# Patient Record
Sex: Female | Born: 1955 | Race: White | Hispanic: No | Marital: Married | Smoking: Never smoker
Health system: Southern US, Community
[De-identification: ages and names within clinical notes are randomized; demographics above are authoritative.]

## PROBLEM LIST (undated history)

## (undated) DIAGNOSIS — E669 Obesity, unspecified: Secondary | ICD-10-CM

## (undated) DIAGNOSIS — E785 Hyperlipidemia, unspecified: Secondary | ICD-10-CM

## (undated) DIAGNOSIS — E039 Hypothyroidism, unspecified: Secondary | ICD-10-CM

## (undated) DIAGNOSIS — M8580 Other specified disorders of bone density and structure, unspecified site: Secondary | ICD-10-CM

## (undated) DIAGNOSIS — D649 Anemia, unspecified: Secondary | ICD-10-CM

## (undated) DIAGNOSIS — M1990 Unspecified osteoarthritis, unspecified site: Secondary | ICD-10-CM

## (undated) DIAGNOSIS — I10 Essential (primary) hypertension: Secondary | ICD-10-CM

## (undated) DIAGNOSIS — J45909 Unspecified asthma, uncomplicated: Secondary | ICD-10-CM

## (undated) HISTORY — PX: THYROID LOBECTOMY: SHX420

## (undated) HISTORY — PX: FUNCTIONAL ENDOSCOPIC SINUS SURGERY: SUR616

---

## 2005-07-11 ENCOUNTER — Ambulatory Visit: Payer: Self-pay

## 2006-07-30 ENCOUNTER — Ambulatory Visit: Payer: Self-pay

## 2006-12-17 ENCOUNTER — Ambulatory Visit: Payer: Self-pay | Admitting: Specialist

## 2007-09-03 ENCOUNTER — Ambulatory Visit (HOSPITAL_COMMUNITY): Admission: RE | Admit: 2007-09-03 | Discharge: 2007-09-03 | Payer: Self-pay | Admitting: Orthopedic Surgery

## 2007-09-08 ENCOUNTER — Ambulatory Visit: Payer: Self-pay

## 2007-10-18 ENCOUNTER — Ambulatory Visit: Payer: Self-pay | Admitting: Gastroenterology

## 2008-08-30 ENCOUNTER — Encounter: Payer: Self-pay | Admitting: Specialist

## 2008-09-11 ENCOUNTER — Encounter: Payer: Self-pay | Admitting: Specialist

## 2008-09-12 ENCOUNTER — Ambulatory Visit: Payer: Self-pay

## 2009-09-13 ENCOUNTER — Ambulatory Visit: Payer: Self-pay

## 2009-09-26 ENCOUNTER — Ambulatory Visit: Payer: Self-pay

## 2010-09-17 ENCOUNTER — Ambulatory Visit: Payer: Self-pay

## 2010-09-19 ENCOUNTER — Ambulatory Visit: Payer: Self-pay

## 2010-10-13 HISTORY — PX: BREAST BIOPSY: SHX20

## 2010-10-14 ENCOUNTER — Ambulatory Visit: Payer: Self-pay | Admitting: Surgery

## 2010-10-15 LAB — PATHOLOGY REPORT

## 2011-04-21 ENCOUNTER — Ambulatory Visit: Payer: Self-pay | Admitting: Surgery

## 2011-12-03 ENCOUNTER — Ambulatory Visit: Payer: Self-pay

## 2012-12-08 ENCOUNTER — Ambulatory Visit: Payer: Self-pay

## 2013-01-25 ENCOUNTER — Ambulatory Visit: Payer: Self-pay | Admitting: Family Medicine

## 2013-02-21 ENCOUNTER — Ambulatory Visit: Payer: Self-pay | Admitting: Internal Medicine

## 2013-09-22 DIAGNOSIS — M8580 Other specified disorders of bone density and structure, unspecified site: Secondary | ICD-10-CM | POA: Insufficient documentation

## 2013-09-22 DIAGNOSIS — E785 Hyperlipidemia, unspecified: Secondary | ICD-10-CM | POA: Insufficient documentation

## 2013-12-19 ENCOUNTER — Ambulatory Visit: Payer: Self-pay

## 2014-11-28 ENCOUNTER — Other Ambulatory Visit: Payer: Self-pay | Admitting: Obstetrics and Gynecology

## 2014-11-28 DIAGNOSIS — Z1231 Encounter for screening mammogram for malignant neoplasm of breast: Secondary | ICD-10-CM

## 2014-12-20 ENCOUNTER — Other Ambulatory Visit: Payer: Self-pay

## 2014-12-20 MED ORDER — MONTELUKAST SODIUM 10 MG PO TABS: 10.0000 mg | ORAL_TABLET | Freq: Every day | ORAL | Status: DC

## 2014-12-21 ENCOUNTER — Ambulatory Visit
Admission: RE | Admit: 2014-12-21 | Discharge: 2014-12-21 | Disposition: A | Discharge status: Home / Self Care | Payer: BLUE CROSS/BLUE SHIELD | Source: Ambulatory Visit | Attending: Obstetrics and Gynecology | Admitting: Obstetrics and Gynecology

## 2014-12-21 ENCOUNTER — Other Ambulatory Visit: Payer: Self-pay | Admitting: Family Medicine

## 2014-12-21 DIAGNOSIS — Z1231 Encounter for screening mammogram for malignant neoplasm of breast: Secondary | ICD-10-CM

## 2014-12-21 MED ORDER — MONTELUKAST SODIUM 10 MG PO TABS: 10.0000 mg | ORAL_TABLET | Freq: Every day | ORAL | Status: DC

## 2014-12-23 ENCOUNTER — Other Ambulatory Visit: Payer: Self-pay | Admitting: Family Medicine

## 2015-02-20 DIAGNOSIS — M25559 Pain in unspecified hip: Secondary | ICD-10-CM | POA: Insufficient documentation

## 2015-04-18 ENCOUNTER — Ambulatory Visit (INDEPENDENT_AMBULATORY_CARE_PROVIDER_SITE_OTHER): Payer: BLUE CROSS/BLUE SHIELD | Admitting: *Deleted

## 2015-04-18 VITALS — Temp 98.5°F | Resp 16 | Ht 65.7 in | Wt 218.0 lb

## 2015-04-18 DIAGNOSIS — Z23 Encounter for immunization: Secondary | ICD-10-CM

## 2015-06-18 ENCOUNTER — Ambulatory Visit: Payer: BLUE CROSS/BLUE SHIELD | Attending: Nurse Practitioner | Admitting: Physical Therapy

## 2015-06-18 DIAGNOSIS — R29898 Other symptoms and signs involving the musculoskeletal system: Secondary | ICD-10-CM

## 2015-06-18 DIAGNOSIS — M25552 Pain in left hip: Secondary | ICD-10-CM | POA: Diagnosis present

## 2015-06-18 DIAGNOSIS — M25551 Pain in right hip: Secondary | ICD-10-CM | POA: Insufficient documentation

## 2015-06-18 DIAGNOSIS — M6289 Other specified disorders of muscle: Secondary | ICD-10-CM | POA: Diagnosis present

## 2015-06-18 NOTE — Therapy (Signed)
[place] [facility] [address], [place], [address] Phone: [phone]   Fax:  [phone]  Physical Therapy Evaluation  Patient Details  Name: IYANUOLUWA ROIGER MRN: [id number] Date of Birth: Jun 15, 1956 Referring Provider: Sabra Heck  Encounter Date: 06/18/2015      PT End of Session - 06/18/15 1723    Visit Number 1   Number of Visits 13   Date for PT Re-Evaluation 07/30/15   PT Start Time 1630   PT Stop Time 1710   PT Time Calculation (min) 40 min   Activity Tolerance Patient tolerated treatment well;Patient limited by pain   Behavior During Therapy [facility] for tasks assessed/performed      No past medical history on file.  Past Surgical History  Procedure Laterality Date  . Breast biopsy Right 10/2010    negative stereotactic    There were no vitals filed for this visit.  Visit Diagnosis:  Bilateral hip pain - Plan: PT plan of care cert/re-cert  Weakness of both hips - Plan: PT plan of care cert/re-cert      Subjective Assessment - 06/18/15 1713    Subjective Patient reports she has ad bilateral hip pain for the last year. She is unable to walk like she used to and cannot exercise anymore. She has had 3 cortisone shots in her hips that did not help at all.    Limitations Walking;House hold activities   How long can you sit comfortably? 1 hr   How long can you stand comfortably? < 1hr   How long can you walk comfortably? <85min   Diagnostic tests xrays- showed no arthritis   Patient Stated Goals to be able to walk and exercise like she used to. To decrease pain.    Currently in Pain? Yes   Pain Score 6    Pain Location Hip   Pain Orientation Right;Left;Lateral;Proximal   Pain Descriptors / Indicators Aching   Pain Type Chronic pain   Pain Onset More than a month ago   Pain Frequency Intermittent   Aggravating Factors  walking, standing   Pain Relieving Factors sitting   Effect of Pain on Daily  Activities unable to walk long distances or exercise   Multiple Pain Sites No            [facility] PT Assessment - 06/18/15 0001    Assessment   Medical Diagnosis bilateral hip bursitis   Referring Provider Sabra Heck   Onset Date/Surgical Date 04/17/14   Prior Therapy No   Precautions   Precautions None   Restrictions   Weight Bearing Restrictions No   Balance Screen   Has the patient fallen in the past 6 months No   Has the patient had a decrease in activity level because of a fear of falling?  Yes   Is the patient reluctant to leave their home because of a fear of falling?  No   Home Social worker Private residence   Living Arrangements Spouse/significant other   Type of [place] to enter   Entrance Stairs-Number of Steps [place] - 2 wheels;Cane - single point;Grab bars - toilet;Tub bench;Bedside commode   Prior Function   Level of Independence Independent   Vocation Full time employment   Leisure Golf, exercise, walking   Cognition   Overall Cognitive Status Within Functional Limits for tasks assessed   Observation/Other Assessments  Lower Extremity Functional Scale  24/80   ROM / Strength   AROM / PROM / Strength PROM;Strength   PROM   Overall PROM  Within functional limits for tasks performed   Strength   Overall Strength Deficits   Overall Strength Comments Patient demonstrates weakness in bilateral hip flexors, 3+/5   Ambulation/Gait   Gait velocity 10 meters in <8 sec         Treatment this session to include: Rolling of left hip and manual therapy STM to bilateral hips for pain reduction. Exercises to include: Sidelying clam shells, prone hip extension, quadruped hip extension and abduction x 10 reps each bilaterally.   Cues and demonstration provided to patient.        PT Education - 06/18/15 1723    Education provided Yes   Education Details POC, HEP    Person(s) Educated Patient   Methods Explanation;Demonstration   Comprehension Verbalized understanding;Returned demonstration;Verbal cues required             PT Long Term Goals - 06/18/15 1728    PT LONG TERM GOAL #1   Title Patient will report decreased pain with aggrivating activities to < 3/10   Baseline 6/10   Time 6   Period Weeks   Status New   PT LONG TERM GOAL #2   Title Patient will be able to walk for > 6 min without pain reported for >1200'   Baseline Unable to try   Time 6   Period Weeks   Status New   PT LONG TERM GOAL #3   Title Patient will have improved functional independence with LEFS score of > 35/80   Baseline 24/80   Time 6   Period Weeks   Status New               Plan - 06/18/15 1724    Clinical Impression Statement Patient is a 59 year old female who reports bilateral hip pain with walking. Patient reports she had negative xrays and has received steroid injections which did not help. Pt has been dealing with this pain for the last year. She is unable to walk like she used to for exercise.    Pt will benefit from skilled therapeutic intervention in order to improve on the following deficits Decreased strength;Pain;Difficulty walking;Decreased activity tolerance   Rehab Potential Fair   PT Frequency 2x / week   PT Duration 6 weeks   PT Treatment/Interventions Aquatic Therapy;Moist Heat;Therapeutic exercise;Therapeutic activities;Dry needling;Patient/family education;Manual techniques   PT Next Visit Plan manual therapy for pain, muscle irritation, exercises to improve hip and glute strength.    PT Home Exercise Plan clam shells, hip extension in prone.    Consulted and Agree with Plan of Care Patient         Problem List There are no active problems to display for this patient.   Venia Riveron, MPT, GCS 06/18/2015, 5:32 PM  [place] [facility] [address]  [place], [address] Phone: [phone]   Fax:  [phone]  Name: SUEANN JOSSELYN MRN: [id number] Date of Birth: 1956/02/25

## 2015-06-21 ENCOUNTER — Ambulatory Visit: Payer: BLUE CROSS/BLUE SHIELD | Admitting: Physical Therapy

## 2015-06-21 DIAGNOSIS — M25552 Pain in left hip: Principal | ICD-10-CM

## 2015-06-21 DIAGNOSIS — M25551 Pain in right hip: Secondary | ICD-10-CM | POA: Diagnosis not present

## 2015-06-21 DIAGNOSIS — R29898 Other symptoms and signs involving the musculoskeletal system: Secondary | ICD-10-CM

## 2015-06-21 NOTE — Therapy (Signed)
[place] [facility] [address] [place], [address] Phone: [phone]   Fax:  [phone]  Physical Therapy Treatment  Patient Details  Name: Catherine Mcclain MRN: [id number] Date of Birth: April 14, 1956 Referring Provider: Dayton Martes  Encounter Date: 06/21/2015      PT End of Session - 06/21/15 1705    Visit Number 2   Number of Visits 13   Date for PT Re-Evaluation 07/30/15   PT Start Time 1630   PT Stop Time 1700   PT Time Calculation (min) 30 min   Activity Tolerance Patient tolerated treatment well;Patient limited by pain   Behavior During Therapy [facility] for tasks assessed/performed      No past medical history on file.  Past Surgical History  Procedure Laterality Date  . Breast biopsy Right 10/2010    negative stereotactic    There were no vitals filed for this visit.  Visit Diagnosis:  Bilateral hip pain  Weakness of both hips      Subjective Assessment - 06/21/15 1659    Subjective Patient reports she was very sore after first session. Feeling about the same so far.    Limitations Walking;House hold activities   How long can you sit comfortably? 1 hr   How long can you stand comfortably? < 1hr   How long can you walk comfortably? <77min   Diagnostic tests xrays- showed no arthritis   Patient Stated Goals to be able to walk and exercise like she used to. To decrease pain.    Currently in Pain? Yes   Pain Location Hip   Pain Orientation Right;Left   Pain Descriptors / Indicators Aching;Sore   Pain Type Chronic pain   Pain Onset More than a month ago   Pain Frequency Intermittent   Multiple Pain Sites No          Treatment this session to include: manual therapy,  STM to bilateral hips for pain reduction. PA joint mobs to lower back grade I x 3 bouts. Exercises to include: prone hip extension, quadruped hip abduction and hip machine:abduction 40# x 10 reps, hip extension 55# x 10 reps each  bilaterally. Single leg kneeling x 1 min on each side for stability.Cga for safety.   Cues and demonstration provided to patient.           PT Education - 06/21/15 1704    Education provided Yes   Education Details HEp and proper form, rational.   Person(s) Educated Patient   Methods Explanation;Demonstration   Comprehension Verbalized understanding;Returned demonstration             PT Long Term Goals - 06/18/15 1728    PT LONG TERM GOAL #1   Title Patient will report decreased pain with aggrivating activities to < 3/10   Baseline 6/10   Time 6   Period Weeks   Status New   PT LONG TERM GOAL #2   Title Patient will be able to walk for > 6 min without pain reported for >1200'   Baseline Unable to try   Time 6   Period Weeks   Status New   PT LONG TERM GOAL #3   Title Patient will have improved functional independence with LEFS score of > 35/80   Baseline 24/80   Time 6   Period Weeks   Status New               Plan - 06/21/15 1706  Clinical Impression Statement Patient reports significant soreness after first treatment, she tolerated activities well with session. We kept session on the shorter side to progress slowly.    Pt will benefit from skilled therapeutic intervention in order to improve on the following deficits Decreased strength;Pain;Difficulty walking;Decreased activity tolerance   Rehab Potential Fair   PT Frequency 2x / week   PT Duration 6 weeks   PT Treatment/Interventions Aquatic Therapy;Moist Heat;Therapeutic exercise;Therapeutic activities;Dry needling;Patient/family education;Manual techniques   PT Next Visit Plan manual therapy for pain, muscle irritation, exercises to improve hip and glute strength.    PT Home Exercise Plan clam shells, hip extension in prone.    Consulted and Agree with Plan of Care Patient        Problem List There are no active problems to display for this patient.   Jani Moronta, PT, MPT,  GCS 06/21/2015, 5:08 PM  [place] [facility] [address], [place], [address] Phone: [phone]   Fax:  [phone]  Name: TRUDEE ORTEGA MRN: [id number] Date of Birth: [birth year]

## 2015-06-25 ENCOUNTER — Ambulatory Visit: Payer: BLUE CROSS/BLUE SHIELD | Admitting: Physical Therapy

## 2015-06-25 DIAGNOSIS — M25551 Pain in right hip: Secondary | ICD-10-CM | POA: Diagnosis not present

## 2015-06-25 DIAGNOSIS — R29898 Other symptoms and signs involving the musculoskeletal system: Secondary | ICD-10-CM

## 2015-06-25 DIAGNOSIS — M25552 Pain in left hip: Principal | ICD-10-CM

## 2015-06-25 NOTE — Therapy (Signed)
[place] [facility] [address] [place], [address] Phone: [phone]   Fax:  [phone]  Physical Therapy Treatment  Patient Details  Name: Catherine Mcclain MRN: [id number] Date of Birth: Apr 06, 1956 Referring Provider: Dayton Martes  Encounter Date: 06/25/2015      PT End of Session - 06/25/15 1711    Visit Number 3   Number of Visits 13   Date for PT Re-Evaluation 07/30/15   PT Start Time 1630   PT Stop Time 1710   PT Time Calculation (min) 40 min   Activity Tolerance Patient tolerated treatment well;No increased pain   Behavior During Therapy [facility] for tasks assessed/performed      No past medical history on file.  Past Surgical History  Procedure Laterality Date  . Breast biopsy Right 10/2010    negative stereotactic    There were no vitals filed for this visit.  Visit Diagnosis:  Bilateral hip pain  Weakness of both hips      Subjective Assessment - 06/25/15 1707    Subjective Patient reports she was blowing leaves yesterday and her back is a little sore. Hips are feeling less tender.   Limitations Walking;House hold activities   Patient Stated Goals to be able to walk and exercise like she used to. To decrease pain.    Currently in Pain? Yes   Pain Location Hip   Pain Orientation Right;Left   Pain Descriptors / Indicators Aching;Sore   Pain Type Chronic pain   Pain Onset More than a month ago   Pain Frequency Intermittent   Multiple Pain Sites No          Treatment this session to include: manual therapy, STM to bilateral hips for pain reduction.  Exercises to include: prone hip extension 2 x10,  hip machine:abduction 40# x 10 reps, 25# x 10 reps, hip extension 55# 3 x 10 reps each bilaterally.  Cues and demonstration provided to patient.            PT Education - 06/25/15 1710    Education provided Yes   Education Details Proper form with exercises.    Person(s) Educated  Patient   Methods Explanation;Demonstration   Comprehension Verbalized understanding;Returned demonstration             PT Long Term Goals - 06/18/15 1728    PT LONG TERM GOAL #1   Title Patient will report decreased pain with aggrivating activities to < 3/10   Baseline 6/10   Time 6   Period Weeks   Status New   PT LONG TERM GOAL #2   Title Patient will be able to walk for > 6 min without pain reported for >1200'   Baseline Unable to try   Time 6   Period Weeks   Status New   PT LONG TERM GOAL #3   Title Patient will have improved functional independence with LEFS score of > 35/80   Baseline 24/80   Time 6   Period Weeks   Status New               Plan - 06/25/15 1712    Clinical Impression Statement Patient reports she is feeling some decreased soreness.    Pt will benefit from skilled therapeutic intervention in order to improve on the following deficits Decreased strength;Pain;Difficulty walking;Decreased activity tolerance   Rehab Potential Fair   PT Frequency 2x / week   PT Duration 6 weeks   PT  Treatment/Interventions Aquatic Therapy;Moist Heat;Therapeutic exercise;Therapeutic activities;Dry needling;Patient/family education;Manual techniques   PT Next Visit Plan manual therapy for pain, muscle irritation, exercises to improve hip and glute strength.    PT Home Exercise Plan clam shells, hip extension in prone.    Consulted and Agree with Plan of Care Patient        Problem List There are no active problems to display for this patient.   Dilyn Smiles, PT, MPT, GCS 06/25/2015, 5:15 PM  [place] [facility] [address], [place], [address] Phone: [phone]   Fax:  [phone]  Name: Catherine Mcclain MRN: [id number] Date of Birth: 1955/10/23

## 2015-06-28 ENCOUNTER — Ambulatory Visit: Payer: BLUE CROSS/BLUE SHIELD | Admitting: Physical Therapy

## 2015-06-28 DIAGNOSIS — M25552 Pain in left hip: Principal | ICD-10-CM

## 2015-06-28 DIAGNOSIS — R29898 Other symptoms and signs involving the musculoskeletal system: Secondary | ICD-10-CM

## 2015-06-28 DIAGNOSIS — M25551 Pain in right hip: Secondary | ICD-10-CM

## 2015-06-28 NOTE — Therapy (Signed)
[place] [facility] [address] [place], [address] Phone: [phone]   Fax:  [phone]  Physical Therapy Treatment  Patient Details  Name: Catherine Mcclain MRN: [id number] Date of Birth: 03-08-1956 Referring Provider: Dayton Martes  Encounter Date: 06/28/2015      PT End of Session - 06/28/15 1709    Visit Number 4   Number of Visits 13   Date for PT Re-Evaluation 07/30/15   PT Start Time 1630   PT Stop Time 1708   PT Time Calculation (min) 38 min   Activity Tolerance Patient tolerated treatment well;No increased pain   Behavior During Therapy [facility] for tasks assessed/performed      No past medical history on file.  Past Surgical History  Procedure Laterality Date  . Breast biopsy Right 10/2010    negative stereotactic    There were no vitals filed for this visit.  Visit Diagnosis:  Bilateral hip pain  Weakness of both hips      Subjective Assessment - 06/28/15 1707    Subjective Patient reports she feels some improvement.    Limitations Walking;House hold activities   How long can you sit comfortably? 1 hr   How long can you stand comfortably? < 1hr   How long can you walk comfortably? <66min   Diagnostic tests xrays- showed no arthritis   Patient Stated Goals to be able to walk and exercise like she used to. To decrease pain.    Currently in Pain? No/denies   Pain Location Hip   Pain Orientation Right;Left   Pain Descriptors / Indicators Aching;Sore   Pain Type Chronic pain   Pain Onset More than a month ago   Pain Frequency Intermittent   Multiple Pain Sites No           Treatment this session to include: manual therapy,rolling of B hips with "the stick" STM to bilateral hips for pain reduction.  Exercises to include: prone hip extension 2 x10, hip machine:abduction 40# x 10 reps, 25# x 10 reps, hip extension 55# 3 x 10 reps each bilaterally. Leg extension machine  2x10 reps 25#,  wedding march gait x 30'x 3 reps with 5# in each hand.   Cues and demonstration provided to patient          PT Education - 06/28/15 1709    Education provided Yes   Education Details new exercises and rationale for exercises/modalities.    Person(s) Educated Patient   Methods Explanation;Demonstration;Verbal cues   Comprehension Verbalized understanding;Returned demonstration             PT Long Term Goals - 06/18/15 1728    PT LONG TERM GOAL #1   Title Patient will report decreased pain with aggrivating activities to < 3/10   Baseline 6/10   Time 6   Period Weeks   Status New   PT LONG TERM GOAL #2   Title Patient will be able to walk for > 6 min without pain reported for >1200'   Baseline Unable to try   Time 6   Period Weeks   Status New   PT LONG TERM GOAL #3   Title Patient will have improved functional independence with LEFS score of > 35/80   Baseline 24/80   Time 6   Period Weeks   Status New               Plan - 06/28/15 1713    Clinical Impression Statement  Patient is progressing well. She is able to tolerate increased activity without as much pain.    Pt will benefit from skilled therapeutic intervention in order to improve on the following deficits Decreased strength;Pain;Difficulty walking;Decreased activity tolerance   Rehab Potential Fair   PT Frequency 2x / week   PT Duration 6 weeks   PT Treatment/Interventions Aquatic Therapy;Moist Heat;Therapeutic exercise;Therapeutic activities;Dry needling;Patient/family education;Manual techniques   PT Next Visit Plan manual therapy for pain, muscle irritation, exercises to improve hip and glute strength.    PT Home Exercise Plan clam shells, hip extension in prone.    Consulted and Agree with Plan of Care Patient        Problem List There are no active problems to display for this patient.   Cherl Gorney, PT, MPT, GCS 06/28/2015, 5:15 PM  [place] [facility] [address], [place], [address] Phone: [phone]   Fax:  [phone]  Name: Catherine Mcclain MRN: [id number] Date of Birth: 01-15-1956

## 2015-07-02 ENCOUNTER — Ambulatory Visit: Payer: BLUE CROSS/BLUE SHIELD | Attending: Nurse Practitioner | Admitting: Physical Therapy

## 2015-07-02 DIAGNOSIS — M25552 Pain in left hip: Secondary | ICD-10-CM | POA: Diagnosis present

## 2015-07-02 DIAGNOSIS — R29898 Other symptoms and signs involving the musculoskeletal system: Secondary | ICD-10-CM

## 2015-07-02 DIAGNOSIS — M6289 Other specified disorders of muscle: Secondary | ICD-10-CM | POA: Insufficient documentation

## 2015-07-02 DIAGNOSIS — M25551 Pain in right hip: Secondary | ICD-10-CM | POA: Insufficient documentation

## 2015-07-02 NOTE — Therapy (Signed)
[place] [facility] [address], [place], [address] Phone: [phone]   Fax:  [phone]  Physical Therapy Treatment  Patient Details  Name: Catherine Mcclain MRN: [id number] Date of Birth: 03-31-1956 Referring Provider: Dayton Martes  Encounter Date: 07/02/2015      PT End of Session - 07/02/15 1710    Visit Number 5   Number of Visits 13   Date for PT Re-Evaluation 07/30/15   PT Start Time 1625   PT Stop Time [id number]   PT Time Calculation (min) 40 min   Activity Tolerance Patient tolerated treatment well;No increased pain   Behavior During Therapy [facility] for tasks assessed/performed      No past medical history on file.  Past Surgical History  Procedure Laterality Date  . Breast biopsy Right 10/2010    negative stereotactic    There were no vitals filed for this visit.  Visit Diagnosis:  Bilateral hip pain  Weakness of both hips      Subjective Assessment - 07/02/15 1708    Subjective Patient reports she was able to do more walking over the weekend without pain. Reports she feels improvement.    Limitations Walking;House hold activities   Patient Stated Goals to be able to walk and exercise like she used to. To decrease pain.    Currently in Pain? Yes   Pain Location Hip   Pain Orientation Right;Left   Pain Descriptors / Indicators Aching;Tender;Sore   Pain Type Chronic pain   Pain Onset More than a month ago   Pain Frequency Intermittent   Multiple Pain Sites No        Treatment this session to include: manual therapy,rolling of B hips with "the stick" STM to bilateral hips for pain reduction.  Exercises to include: prone hip extension 2 x10, hip machine:abduction  25#  2x 10 reps, hip extension 55# 3 x 10 reps each bilaterally. Leg extension machine  3x10 reps 25#, wedding march gait x 30'x 3 reps with 5# in each hand. Side stepping x 3 reps of 20' with red band resistance.  Seated hip abduction  with blue band x 20.  Cues and demonstration provided to patient           PT Education - 07/02/15 1709    Education provided Yes   Education Details new activities   Person(s) Educated Patient   Methods Explanation;Demonstration;Verbal cues   Comprehension Verbalized understanding;Returned demonstration;Verbal cues required             PT Long Term Goals - 06/18/15 1728    PT LONG TERM GOAL #1   Title Patient will report decreased pain with aggrivating activities to < 3/10   Baseline 6/10   Time 6   Period Weeks   Status New   PT LONG TERM GOAL #2   Title Patient will be able to walk for > 6 min without pain reported for >1200'   Baseline Unable to try   Time 6   Period Weeks   Status New   PT LONG TERM GOAL #3   Title Patient will have improved functional independence with LEFS score of > 35/80   Baseline 24/80   Time 6   Period Weeks   Status New               Plan - 07/02/15 1711    Clinical Impression Statement Patient is making good progress, decreased pain and able to to walk increased  distances.    Pt will benefit from skilled therapeutic intervention in order to improve on the following deficits Decreased strength;Pain;Difficulty walking;Decreased activity tolerance   Rehab Potential Fair   PT Frequency 2x / week   PT Duration 6 weeks   PT Treatment/Interventions Aquatic Therapy;Moist Heat;Therapeutic exercise;Therapeutic activities;Dry needling;Patient/family education;Manual techniques   PT Next Visit Plan manual therapy for pain, muscle irritation, exercises to improve hip and glute strength.    PT Home Exercise Plan clam shells, hip extension in prone.    Consulted and Agree with Plan of Care Patient        Problem List There are no active problems to display for this patient.   Breana Litts, PT, MPT, GCS  07/02/2015, 5:13 PM  [place] [facility] [address], [place], [address] Phone: [phone]   Fax:  [phone]  Name: DIEDRA DEBACA MRN: [id number] Date of Birth: December 27, 1955

## 2015-07-05 ENCOUNTER — Ambulatory Visit: Payer: BLUE CROSS/BLUE SHIELD | Admitting: Physical Therapy

## 2015-07-05 DIAGNOSIS — R29898 Other symptoms and signs involving the musculoskeletal system: Secondary | ICD-10-CM

## 2015-07-05 DIAGNOSIS — M25551 Pain in right hip: Secondary | ICD-10-CM

## 2015-07-05 DIAGNOSIS — M25552 Pain in left hip: Principal | ICD-10-CM

## 2015-07-05 NOTE — Therapy (Signed)
[facility] [address], [place], [address] Phone: [phone]   Fax:  [phone]  Physical Therapy Treatment  Patient Details  Name: Catherine Mcclain MRN: [id number] Date of Birth: 12/23/1955 Referring Provider: Dayton Martes  Encounter Date: 07/05/2015      PT End of Session - 07/05/15 1715    Visit Number 6   Number of Visits 13   Date for PT Re-Evaluation 07/30/15   PT Start Time 0435   PT Stop Time 0515   PT Time Calculation (min) 40 min   Activity Tolerance Patient tolerated treatment well;No increased pain   Behavior During Therapy [facility] for tasks assessed/performed      No past medical history on file.  Past Surgical History  Procedure Laterality Date  . Breast biopsy Right 10/2010    negative stereotactic    There were no vitals filed for this visit.  Visit Diagnosis:  Bilateral hip pain  Weakness of both hips      Subjective Assessment - 07/05/15 1714    Subjective Patient reports she was able to walk a lap around at work which is improvement for her. Reports decreased pain.    Limitations Walking;House hold activities   Patient Stated Goals to be able to walk and exercise like she used to. To decrease pain.    Currently in Pain? No/denies   Multiple Pain Sites No          Treatment this session to include: manual therapy,rolling of B hips with "the stick" STM to bilateral hips for pain reduction.  Exercises to include: prone hip extension 3 x10, hip machine:abduction 25# 3x 10 reps, hip extension 55# 3 x 10 reps each bilaterally. Leg extension machine  3x10 reps 25#, wedding march gait forward and backward x 20'x 3 reps with red band around thighs. Side stepping x 5 reps of 20' with red band resistance.  Lateral step ups on 8 " step x 10 bilaterally.  Cues and demonstration provided to patient            PT Education - 07/05/15 1714    Education provided Yes   Education Details new exercises, proper form.    Person(s) Educated Patient   Methods Explanation;Demonstration   Comprehension Verbalized understanding;Returned demonstration             PT Long Term Goals - 06/18/15 1728    PT LONG TERM GOAL #1   Title Patient will report decreased pain with aggrivating activities to < 3/10   Baseline 6/10   Time 6   Period Weeks   Status New   PT LONG TERM GOAL #2   Title Patient will be able to walk for > 6 min without pain reported for >1200'   Baseline Unable to try   Time 6   Period Weeks   Status New   PT LONG TERM GOAL #3   Title Patient will have improved functional independence with LEFS score of > 35/80   Baseline 24/80   Time 6   Period Weeks   Status New               Problem List There are no active problems to display for this patient.   Lyrik Buresh, PT, MPT, GCS 07/05/2015, 5:17 PM  [place] [facility] [address], [place], [address] Phone: [phone]   Fax:  [phone]  Name: Catherine Mcclain MRN: [id number]  Date of Birth: 18-Jul-1955

## 2015-07-12 ENCOUNTER — Ambulatory Visit: Payer: BLUE CROSS/BLUE SHIELD | Admitting: Physical Therapy

## 2015-07-12 DIAGNOSIS — M25552 Pain in left hip: Principal | ICD-10-CM

## 2015-07-12 DIAGNOSIS — R29898 Other symptoms and signs involving the musculoskeletal system: Secondary | ICD-10-CM

## 2015-07-12 DIAGNOSIS — M25551 Pain in right hip: Secondary | ICD-10-CM

## 2015-07-12 NOTE — Therapy (Signed)
[place] [address], [place], [address] Phone: [phone]   Fax:  [phone]  Physical Therapy Treatment  Patient Details  Name: Catherine Mcclain MRN: [id number] Date of Birth: 1955/08/01 Referring Provider: Dayton Martes  Encounter Date: 07/12/2015      PT End of Session - 07/12/15 1742    Visit Number 7   Number of Visits 13   Date for PT Re-Evaluation 07/30/15   PT Start Time 1630   PT Stop Time [id number]   PT Time Calculation (min) 35 min   Activity Tolerance Patient tolerated treatment well;No increased pain   Behavior During Therapy [facility] for tasks assessed/performed      No past medical history on file.  Past Surgical History  Procedure Laterality Date  . Breast biopsy Right 10/2010    negative stereotactic    There were no vitals filed for this visit.  Visit Diagnosis:  Bilateral hip pain  Weakness of both hips      Subjective Assessment - 07/12/15 1740    Subjective Patient reports she has been more sore over the last few days due to the holidays.    Limitations Walking;House hold activities   Patient Stated Goals to be able to walk and exercise like she used to. To decrease pain.    Currently in Pain? Yes   Pain Score 2    Pain Location Hip   Pain Orientation Right;Left   Pain Descriptors / Indicators Aching;Sore   Pain Type Chronic pain   Pain Onset More than a month ago   Pain Frequency Intermittent   Multiple Pain Sites No         Treatment this session to include: STM of hips and lateral quads, glute med bilaterally,rolling of B hips with "the stick" STM to bilateral hips for pain reduction.  Exercises to include: prone hip extension 3 x10, hip machine:abduction 25# 3x 10 reps, hip extension 55# 3 x 10 reps each bilaterally. knee extension machine  3x10 reps 25#, wedding march gait forward and backward x 20'x 3 reps with red band around thighs. Side stepping x 5 reps of  20' with red band resistance.  Lateral step ups on 4 " step x 10 bilaterally.  Cues and demonstration provided to patient            PT Education - 07/12/15 1742    Education provided Yes   Education Details set up and proper form for exercises.    Person(s) Educated Patient   Methods Explanation;Demonstration   Comprehension Verbalized understanding;Returned demonstration             PT Long Term Goals - 06/18/15 1728    PT LONG TERM GOAL #1   Title Patient will report decreased pain with aggrivating activities to < 3/10   Baseline 6/10   Time 6   Period Weeks   Status New   PT LONG TERM GOAL #2   Title Patient will be able to walk for > 6 min without pain reported for >1200'   Baseline Unable to try   Time 6   Period Weeks   Status New   PT LONG TERM GOAL #3   Title Patient will have improved functional independence with LEFS score of > 35/80   Baseline 24/80   Time 6   Period Weeks   Status New               Plan - 07/12/15 1744  Clinical Impression Statement Patient is overall making good progress. Having some increased pain with hips after the holidays.   Pt will benefit from skilled therapeutic intervention in order to improve on the following deficits Decreased strength;Pain;Difficulty walking;Decreased activity tolerance   Rehab Potential Fair   PT Frequency 2x / week   PT Duration 6 weeks   PT Treatment/Interventions Aquatic Therapy;Moist Heat;Therapeutic exercise;Therapeutic activities;Dry needling;Patient/family education;Manual techniques   PT Next Visit Plan manual therapy for pain, muscle irritation, exercises to improve hip and glute strength.    PT Home Exercise Plan clam shells, hip extension in prone.    Consulted and Agree with Plan of Care Patient        Problem List There are no active problems to display for this patient.   Enrico Eaddy, PT, MPT, GCS 07/12/2015, 5:50 PM  [place] [address] [place], [address] Phone: [phone]   Fax:  [phone]  Name: Catherine Mcclain MRN: [id number] Date of Birth: 07-09-1956

## 2015-07-16 ENCOUNTER — Other Ambulatory Visit: Payer: Self-pay | Admitting: Family Medicine

## 2015-07-17 ENCOUNTER — Ambulatory Visit: Payer: BLUE CROSS/BLUE SHIELD | Attending: Nurse Practitioner | Admitting: Physical Therapy

## 2015-07-17 DIAGNOSIS — M25552 Pain in left hip: Secondary | ICD-10-CM | POA: Insufficient documentation

## 2015-07-17 DIAGNOSIS — M25551 Pain in right hip: Secondary | ICD-10-CM | POA: Insufficient documentation

## 2015-07-17 DIAGNOSIS — M6289 Other specified disorders of muscle: Secondary | ICD-10-CM | POA: Diagnosis present

## 2015-07-17 DIAGNOSIS — R29898 Other symptoms and signs involving the musculoskeletal system: Secondary | ICD-10-CM

## 2015-07-17 NOTE — Therapy (Signed)
[facility] [address] [place], [address] Phone: [phone]   Fax:  [phone]  Physical Therapy Treatment  Patient Details  Name: Catherine Mcclain MRN: [id number] Date of Birth: May 09, 1956 Referring Provider: Dayton Martes  Encounter Date: 07/17/2015      PT End of Session - 07/17/15 1655    Visit Number 8   Number of Visits 13   Date for PT Re-Evaluation 07/30/15   PT Start Time [id number]   PT Stop Time 1650   PT Time Calculation (min) 33 min   Activity Tolerance Patient tolerated treatment well;No increased pain;Patient limited by fatigue   Behavior During Therapy [facility] for tasks assessed/performed      No past medical history on file.  Past Surgical History  Procedure Laterality Date  . Breast biopsy Right 10/2010    negative stereotactic    There were no vitals filed for this visit.  Visit Diagnosis:  Bilateral hip pain  Weakness of both hips      Subjective Assessment - 07/17/15 1704    Subjective Patient reports she is pleased with her progress, she has been able to ambulate around [place] over the holidays and has found she can more consistently walk the quarter mile loop at work. She reports she has not been as consistent as she would like with her HEP.    Limitations Walking;House hold activities   How long can you sit comfortably? 1 hr   How long can you stand comfortably? < 1hr   Diagnostic tests xrays- showed no arthritis   Patient Stated Goals to be able to walk and exercise like she used to. To decrease pain.    Currently in Pain? No/denies  Reports more fatigue than pain in this session. She reports she experiences soreness after PT sessions   Pain Location Hip   Pain Orientation Right;Left   Pain Descriptors / Indicators Aching;Sore   Pain Type Chronic pain   Pain Onset More than a month ago   Pain Frequency Intermittent   Aggravating Factors  Prolonged weightbearing.    Pain  Relieving Factors Sitting.    Effect of Pain on Daily Activities Unable to ambulate long distances.         Manual trigger point release to ITB/Glute med insertion (mild tenderness in TFL on R)  X 5 minutes   Standing hip abduction in machine 25# x 10, 40# x10 repetitions for 2 sets   Single leg deadlifts x 10 repetitions  For 2 sets (cued for contralateral limb to extend)   Leg Press 25# (easy) increased to 35# x 8 (easy), 45# x 8  Side stepping x 10 with green t-band. Patient felt fatigue onsetting in hips.                           PT Education - 07/17/15 1655    Education provided Yes   Education Details Progression of exercises, to begin walking and exercising as tolerated (small amounts at first)    Person(s) Educated Patient   Methods Explanation;Demonstration;Handout;Verbal cues   Comprehension Returned demonstration;Verbalized understanding             PT Long Term Goals - 06/18/15 1728    PT LONG TERM GOAL #1   Title Patient will report decreased pain with aggrivating activities to < 3/10   Baseline 6/10   Time 6   Period Weeks   Status New  PT LONG TERM GOAL #2   Title Patient will be able to walk for > 6 min without pain reported for >1200'   Baseline Unable to try   Time 6   Period Weeks   Status New   PT LONG TERM GOAL #3   Title Patient will have improved functional independence with LEFS score of > 35/80   Baseline 24/80   Time 6   Period Weeks   Status New               Plan - 07/17/15 1656    Clinical Impression Statement Patient reports she has been ambulating increasing distances prior to onset of symptoms and generally has decreased pain. She reports she continues to have some achiness after PT sessions, though demonstrating improved hip abduction strength in this session demonstrated by increased weight completed.    Pt will benefit from skilled therapeutic intervention in order to improve on the following  deficits Decreased strength;Pain;Difficulty walking;Decreased activity tolerance   Rehab Potential Fair   PT Frequency 2x / week   PT Duration 6 weeks   PT Treatment/Interventions Aquatic Therapy;Moist Heat;Therapeutic exercise;Therapeutic activities;Dry needling;Patient/family education;Manual techniques   PT Next Visit Plan Continue to progress posterolateral hip strengthening.    PT Home Exercise Plan Side stepping with green t-band.    Consulted and Agree with Plan of Care Patient        Problem List There are no active problems to display for this patient.  Kerman Passey, PT, DPT    07/17/2015, 5:07 PM  [facility] [address] [place], [address] Phone: [phone]   Fax:  [phone]  Name: Catherine Mcclain MRN: [id number] Date of Birth: [birth year]

## 2015-07-19 ENCOUNTER — Ambulatory Visit: Payer: BLUE CROSS/BLUE SHIELD | Admitting: Physical Therapy

## 2015-07-19 DIAGNOSIS — M25551 Pain in right hip: Secondary | ICD-10-CM

## 2015-07-19 DIAGNOSIS — R29898 Other symptoms and signs involving the musculoskeletal system: Secondary | ICD-10-CM

## 2015-07-19 DIAGNOSIS — M25552 Pain in left hip: Principal | ICD-10-CM

## 2015-07-19 NOTE — Therapy (Signed)
[place] [facility] [address] [place], [address] Phone: [phone]   Fax:  [phone]  Physical Therapy Treatment  Patient Details  Name: AKSHITHA HUPPE MRN: [id number] Date of Birth: [birth year] Referring Provider: Dayton Martes  Encounter Date: 07/19/2015      PT End of Session - 07/19/15 1724    Visit Number 9   Number of Visits 13   Date for PT Re-Evaluation 07/30/15   PT Start Time [id number]   PT Stop Time 1724   PT Time Calculation (min) 34 min   Activity Tolerance Patient tolerated treatment well   Behavior During Therapy [facility] for tasks assessed/performed      No past medical history on file.  Past Surgical History  Procedure Laterality Date  . Breast biopsy Right 10/2010    negative stereotactic    There were no vitals filed for this visit.  Visit Diagnosis:  Bilateral hip pain  Weakness of both hips      Subjective Assessment - 07/19/15 1711    Subjective Patient reports she was somewhat sore in her TFLs after green theraband side stepping, she attempted to walk her track at work and was limited in attempt today secondary to hip pain.    Limitations Walking;House hold activities   How long can you sit comfortably? 1 hr   How long can you stand comfortably? < 1hr   How long can you walk comfortably? <53min   Diagnostic tests xrays- showed no arthritis   Patient Stated Goals to be able to walk and exercise like she used to. To decrease pain.    Currently in Pain? No/denies  Pain after walking and in TFL after side stepping exercises      TherEx Attempted 6 MW test, patient became symptomatic at 1:45. Provided hip flexor stretch in standing and standing quadricep stretch manually by PT then progressed to towel roll by patient.  Patient ambulated additional 3+ minutes prior to onset of discomfort/fatigue but minimal if any pain in her hips. Noted that patient was lacking expected amount of hip  extension in stance phase of gait.   Leg Press x 45# for 8 (easy), progressed to 55# for 8 for 2 sets (more challenging)   Green t-band side stepping with cuing for keeping toes forward (was subbing TFL minimally) x 10 bilaterally -- no increase in pain.   Ambulated 100' on several occasions to assess for change in pain, continued to decrease and eliminate symptoms any time stretching was provided.                                 PT Long Term Goals - 06/18/15 1728    PT LONG TERM GOAL #1   Title Patient will report decreased pain with aggrivating activities to < 3/10   Baseline 6/10   Time 6   Period Weeks   Status New   PT LONG TERM GOAL #2   Title Patient will be able to walk for > 6 min without pain reported for >1200'   Baseline Unable to try   Time 6   Period Weeks   Status New   PT LONG TERM GOAL #3   Title Patient will have improved functional independence with LEFS score of > 35/80   Baseline 24/80   Time 6   Period Weeks   Status New  Plan - 07/19/15 1729    Clinical Impression Statement Patient initially demonstrates limited hip extension in ambulation, with hip flexor stretching and quadricep stretching she reports significant decrease in pain initially noted during attempt at 6 minute walk test. Patient reported progressively less pain with continued stretching of anterior hip musculature. Patient continues to demonstrate fatigue in posterior hip musculature, though improving distance she can tolerate ambulating.    Pt will benefit from skilled therapeutic intervention in order to improve on the following deficits Decreased strength;Pain;Difficulty walking;Decreased activity tolerance   Rehab Potential Good   PT Frequency 2x / week   PT Duration 6 weeks   PT Treatment/Interventions Aquatic Therapy;Moist Heat;Therapeutic exercise;Therapeutic activities;Dry needling;Patient/family education;Manual techniques   PT Next  Visit Plan Continue to progress posterolateral hip strengthening. Hip flexor/quadricep stretching    PT Home Exercise Plan Side stepping with green t-band. Sit to stands, hip flexor stretching, quadricep stretching   Consulted and Agree with Plan of Care Patient        Problem List There are no active problems to display for this patient.   Kerman Passey, PT, DPT    07/19/2015, 5:53 PM  [place] [facility] [address], [place], [address] Phone: [phone]   Fax:  [phone]  Name: MARQUES CAMILLO MRN: [id number] Date of Birth: April 21, 1956

## 2015-07-23 ENCOUNTER — Encounter: Payer: BLUE CROSS/BLUE SHIELD | Admitting: Physical Therapy

## 2015-07-24 ENCOUNTER — Ambulatory Visit: Payer: BLUE CROSS/BLUE SHIELD | Admitting: Physical Therapy

## 2015-07-24 DIAGNOSIS — M25551 Pain in right hip: Secondary | ICD-10-CM

## 2015-07-24 DIAGNOSIS — M25552 Pain in left hip: Principal | ICD-10-CM

## 2015-07-24 DIAGNOSIS — R29898 Other symptoms and signs involving the musculoskeletal system: Secondary | ICD-10-CM

## 2015-07-25 NOTE — Therapy (Signed)
[place] [facility] [address] [place], [address] Phone: [phone]   Fax:  [phone]  Physical Therapy Treatment  Patient Details  Name: Catherine Mcclain MRN: [id number] Date of Birth: 1955-08-28 Referring Provider: Dayton Martes  Encounter Date: 07/24/2015      PT End of Session - 07/24/15 1722    Visit Number 10   Number of Visits 13   Date for PT Re-Evaluation 07/30/15   PT Start Time 1630   PT Stop Time 3151   PT Time Calculation (min) 45 min   Activity Tolerance Patient tolerated treatment well   Behavior During Therapy [facility] for tasks assessed/performed      No past medical history on file.  Past Surgical History  Procedure Laterality Date  . Breast biopsy Right 10/2010    negative stereotactic    There were no vitals filed for this visit.  Visit Diagnosis:  Bilateral hip pain  Weakness of both hips      Subjective Assessment - 07/24/15 1641    Subjective Patient reports she was able to shovel snow over the weekend (more pain in her elbow from shoveling. She was able to walk a lap around the track with 1 rest break    Limitations Walking;House hold activities   How long can you sit comfortably? 1 hr   How long can you stand comfortably? < 1hr   How long can you walk comfortably? <79mn   Diagnostic tests xrays- showed no arthritis   Patient Stated Goals to be able to walk and exercise like she used to. To decrease pain.    Currently in Pain? No/denies  Less pain noted during ambulaiton.        Leg Press x 55#, 65#, 75# x 10 repetitions of each (75# difficult)  6MW test - 1050'  Ober Test (Positive bilaterally)   Standing IT band stretch 5 repetitions with cuing for form bilaterally (felt increased relief with ambulation)   Kneeling hip flexor stretch with education on technique with 5 repetitions bilaterally with 10-20" holds   Standing hip flexor stretch during ambulation x 8  repetitions with 10" holds bilaterally with decreased symptoms noted  Standing quadricep stretch with HHA x 5 repetitions with 10-15" holds and cuing for technique modication (bringing femur into extension).   Lateral side steps with red t-band 2 sets of 20 repetitions with red t-band (noted fatigue but not pain)   ** After each stretch patient amublated for 1.5 minutes checking for symptoms relative to baseline, reports decreased symptoms after treatment each bout.                           PT Education - 07/24/15 1741    Education provided Yes   Education Details Progression from baseline, instruction in new exercises/stretches   Person(s) Educated Patient   Methods Explanation;Demonstration;Handout   Comprehension Verbalized understanding;Returned demonstration             PT Long Term Goals - 07/24/15 1645    PT LONG TERM GOAL #1   Title Patient will report decreased pain with aggrivating activities to < 3/10   Baseline Patient reports pain while first waking up is now gone, when it has been aggravating it still gets to 5-6/10, now primarily 2-3/10.   Time 6   Period Weeks   Status Partially Met   PT LONG TERM GOAL #2   Title Patient will be able  to walk for > 6 min without pain reported for >1200'   Baseline 1050' in 6MW test    Time 6   Period Weeks   Status Partially Met   PT LONG TERM GOAL #3   Title Patient will have improved functional independence with LEFS score of > 35/80   Baseline 24/80 at baseline, on 07/24/2015 - 57/80   Time 6   Period Weeks   Status Achieved               Plan - 07/24/15 1721    Clinical Impression Statement Patient continues to respond well to directed hip stretching activities and reports decreased pain with walking around the track at work as well as shoveling snow over the weekend. She reports significant improvement in LEFS (24 to 57) and is able to complete 6 MW test with minimal increase in symptoms  (which are relieved by stretching).    Pt will benefit from skilled therapeutic intervention in order to improve on the following deficits Decreased strength;Pain;Difficulty walking;Decreased activity tolerance   Rehab Potential Good   PT Frequency 2x / week   PT Duration 6 weeks   PT Treatment/Interventions Aquatic Therapy;Moist Heat;Therapeutic exercise;Therapeutic activities;Dry needling;Patient/family education;Manual techniques   PT Next Visit Plan Continue to progress posterolateral hip strengthening. Hip flexor/quadricep stretching    PT Home Exercise Plan Side stepping with green t-band. Sit to stands, hip flexor stretching, quadricep stretching   Consulted and Agree with Plan of Care Patient        Problem List There are no active problems to display for this patient.  Kerman Passey, PT, DPT    07/25/2015, 6:23 PM  [facility] [address] [place], [address] Phone: [phone]   Fax:  [phone]  Name: Catherine Mcclain MRN: [id number] Date of Birth: July 14, 1956

## 2015-07-26 ENCOUNTER — Ambulatory Visit: Payer: BLUE CROSS/BLUE SHIELD | Admitting: Physical Therapy

## 2015-07-26 DIAGNOSIS — M25551 Pain in right hip: Secondary | ICD-10-CM

## 2015-07-26 DIAGNOSIS — M25552 Pain in left hip: Principal | ICD-10-CM

## 2015-07-26 DIAGNOSIS — R29898 Other symptoms and signs involving the musculoskeletal system: Secondary | ICD-10-CM

## 2015-07-26 NOTE — Therapy (Signed)
[facility] [address], [place], [address] Phone: [phone]   Fax:  [phone]  Physical Therapy Treatment  Patient Details  Name: Catherine Mcclain MRN: [id number] Date of Birth: August 21, 1955 Referring Provider: Dayton Martes  Encounter Date: 07/26/2015      PT End of Session - 07/26/15 1717    Visit Number 11   Number of Visits 19   Date for PT Re-Evaluation 07/30/15   PT Start Time 1630   PT Stop Time 1714   PT Time Calculation (min) 44 min   Activity Tolerance Patient tolerated treatment well   Behavior During Therapy [facility] for tasks assessed/performed      No past medical history on file.  Past Surgical History  Procedure Laterality Date  . Breast biopsy Right 10/2010    negative stereotactic    There were no vitals filed for this visit.  Visit Diagnosis:  Bilateral hip pain - Plan: PT plan of care cert/re-cert  Weakness of both hips - Plan: PT plan of care cert/re-cert      Subjective Assessment - 07/26/15 1630    Subjective Patient reports she was able to ambulate 2 laps around the track the past two days. She reports she had to stop to complete stretches to relieve pain/impending pain however she was able to complete pain free after stretching.    Limitations Walking;House hold activities   How long can you sit comfortably? 1 hr   How long can you stand comfortably? < 1hr   How long can you walk comfortably? <66mn   Diagnostic tests xrays- showed no arthritis   Patient Stated Goals to be able to walk and exercise like she used to. To decrease pain.    Currently in Pain? No/denies      Standing hip flexor stretch x 5 bilaterally with 5" holds  Sidelying hip extensor stretch x 5 with 15" holds (felt improved stretch in hip with gait)  Supine piriformis stretch x 10 bilaterally with 3-5" holds (felt appropriate stretch, mild improvement in gait).   Belt hip mobilizations x 45" bouts (no  change in gait)  Leg Press x 75# x 10 reps, x 85# for 10 reps (challenging) for 2 sets   Squats with 20# KB with hands on rings x 10 reps for 2 sets   Standing hip extensions x 12, x 10 with green theraband. Fatiguing but not painful.   Able to ambulate x 2 minutes at the end of session with mild sensation of fatigue, not pain.                            PT Education - 07/26/15 1716    Education provided Yes   Education Details Updated HEP and instructed patient to continue to increase ambulation distance around track.    Person(s) Educated Patient   Methods Explanation;Demonstration;Handout   Comprehension Verbalized understanding;Returned demonstration             PT Long Term Goals - 07/24/15 1645    PT LONG TERM GOAL #1   Title Patient will report decreased pain with aggrivating activities to < 3/10   Baseline Patient reports pain while first waking up is now gone, when it has been aggravating it still gets to 5-6/10, now primarily 2-3/10.   Time 6   Period Weeks   Status Partially Met   PT LONG TERM GOAL #2   Title Patient  will be able to walk for > 6 min without pain reported for >1200'   Baseline 1050' in 6MW test    Time 6   Period Weeks   Status Partially Met   PT LONG TERM GOAL #3   Title Patient will have improved functional independence with LEFS score of > 35/80   Baseline 24/80 at baseline, on 07/24/2015 - 57/80   Time 6   Period Weeks   Status Achieved               Plan - 07/26/15 1718    Clinical Impression Statement Patient continues to make progress with therapy reporting both prolonged duration of ambulation before the onset of symptoms as well as decreased intensity when symptoms do present. She demonstrates increased LE strength again in this session with increased weight on squats and leg press completed in this session. Patient encouraged to increase her ambulation at work as tolerated.    Pt will benefit from  skilled therapeutic intervention in order to improve on the following deficits Decreased strength;Pain;Difficulty walking;Decreased activity tolerance   Rehab Potential Good   PT Frequency 2x / week   PT Duration 4 weeks   PT Treatment/Interventions Aquatic Therapy;Moist Heat;Therapeutic exercise;Therapeutic activities;Dry needling;Patient/family education;Manual techniques   PT Next Visit Plan Continue to progress posterolateral hip strengthening. Hip flexor/quadricep stretching    PT Home Exercise Plan Sit to stands with weight in hands as well as standing hip extensions with green t-band.    Consulted and Agree with Plan of Care Patient        Problem List There are no active problems to display for this patient.  Kerman Passey, PT, DPT    07/26/2015, 6:29 PM  [facility] [address], [place], [address] Phone: [phone]   Fax:  [phone]  Name: Catherine Mcclain MRN: [id number] Date of Birth: [birth year]

## 2015-07-31 ENCOUNTER — Ambulatory Visit: Payer: BLUE CROSS/BLUE SHIELD | Admitting: Physical Therapy

## 2015-07-31 ENCOUNTER — Encounter: Payer: BLUE CROSS/BLUE SHIELD | Admitting: Physical Therapy

## 2015-07-31 DIAGNOSIS — R29898 Other symptoms and signs involving the musculoskeletal system: Secondary | ICD-10-CM

## 2015-07-31 DIAGNOSIS — M25551 Pain in right hip: Secondary | ICD-10-CM | POA: Diagnosis not present

## 2015-07-31 DIAGNOSIS — M25552 Pain in left hip: Principal | ICD-10-CM

## 2015-07-31 NOTE — Therapy (Signed)
[facility] [address] [place], [address] Phone: [phone]   Fax:  [phone]  Physical Therapy Treatment  Patient Details  Name: Catherine Mcclain MRN: [id number] Date of Birth: 1955-11-29 Referring Provider: Dayton Martes  Encounter Date: 07/31/2015      PT End of Session - 07/31/15 1717    Visit Number 12   Number of Visits 19   Date for PT Re-Evaluation 08/28/15   PT Start Time 1660   PT Stop Time 1710   PT Time Calculation (min) 39 min   Activity Tolerance Patient tolerated treatment well   Behavior During Therapy [facility] for tasks assessed/performed      No past medical history on file.  Past Surgical History  Procedure Laterality Date  . Breast biopsy Right 10/2010    negative stereotactic    There were no vitals filed for this visit.  Visit Diagnosis:  Bilateral hip pain  Weakness of both hips      Subjective Assessment - 07/31/15 1721    Subjective Patient reports she was able to ambulate 3, then 4 laps around the track. She has also been able to ambulate ~ 1 mile around her neighborhood with minimal increases in pain, which are relieved with stretching.    Limitations Walking;House hold activities   How long can you sit comfortably? 1 hr   How long can you stand comfortably? < 1hr   How long can you walk comfortably? <34mn   Diagnostic tests xrays- showed no arthritis   Patient Stated Goals to be able to walk and exercise like she used to. To decrease pain.    Currently in Pain? No/denies      Leg Press - 75#, 85# (x 10 repetitions) for 2 sets   Farmer's Carry - 20# KB x 30' each side, appropriate activation of contralateral obliques.   Lateral lunges x 10 for 2 sets bilaterally with 3-5" holds   6 MW test - 1120' with ~ 1 minute stretching break at around 4 minute mark, relieved all symptoms.   Swiss ball bridging x 10 with cuing for bringing LLE out into more extension to  increase challenge on RLE. No pain with this  Goblet squats with 20# KB x 10 for 2 sets.                           PT Education - 07/31/15 1716    Education provided Yes   Education Details Educated patient on her progress and maintenance program. Instructed patient to call if she felt she would benefit from any additional PT, however she appears appropriate for discharge.    Person(s) Educated Patient   Methods Explanation;Demonstration;Handout   Comprehension Verbalized understanding;Returned demonstration             PT Long Term Goals - 07/31/15 1644    PT LONG TERM GOAL #1   Title Patient will report decreased pain with aggrivating activities to < 3/10   Baseline Pain used to get to 5-6/10 at baseline, now her worst is a "1 maybe a 2".    Time 6   Period Weeks   Status Achieved   PT LONG TERM GOAL #2   Title Patient will be able to walk for > 6 min without pain reported for >1200'   Baseline 1050' in 6MW test  -- on 07/31/2015 1120'    Time 6   Period Weeks  Status Partially Met   PT LONG TERM GOAL #3   Title Patient will have improved functional independence with LEFS score of > 35/80   Baseline 24/80 at baseline, on 07/24/2015 - 57/80   Time 6   Period Weeks   Status Achieved               Plan - 07/31/15 1718    Clinical Impression Statement Patient is now able to walk ~ 1 mile with minimal increase in symptoms. She reports her pain levels are 1-2 at worst now, and has made excellent progress towards her goals of becoming more active. It appears she is capable of self-managing from this point forward, and was provided with a maintenance program to do so.    Pt will benefit from skilled therapeutic intervention in order to improve on the following deficits Decreased strength;Pain;Difficulty walking;Decreased activity tolerance   Rehab Potential Good   PT Frequency 2x / week   PT Duration 4 weeks   PT Treatment/Interventions Aquatic  Therapy;Moist Heat;Therapeutic exercise;Therapeutic activities;Dry needling;Patient/family education;Manual techniques   PT Next Visit Plan Continue to progress posterolateral hip strengthening. Hip flexor/quadricep stretching    PT Home Exercise Plan See patient instructions.    Consulted and Agree with Plan of Care Patient        Problem List There are no active problems to display for this patient.  Kerman Passey, PT, DPT    07/31/2015, 5:23 PM  [facility] [address] [place], [address] Phone: [phone]   Fax:  [phone]  Name: Catherine Mcclain MRN: [id number] Date of Birth: Jan 21, 1956

## 2015-07-31 NOTE — Patient Instructions (Addendum)
   All exercises provided were adapted from [email or web address].com. Patient was provided a written handout with pictures as described. Any additional cues were manually entered in to handout and copied in to this document.  Leg Press  Preparation  Sit on machine with back on padded support. Place feet on platform. Grasp handles to sides. Execution  Push platform away by extending knees and hips. Return and repeat.    EXERCISE BALL - [place]  Start in a seated position on the ball. Next, slowly walk your feet forward so that the ball is on your upper back. Keep your buttocks and pelvis up off the ball and straight with your thighs.    ** Can have one leg further out to increase the challenge OR add weight on your stomach.   Lateral Lunge  Step to the side with the step foot pointed forward as shown.  Lower the hips so that the step-side thigh is parallel with the floor.  Keep the core tight to prevent bending in the lumbar spine.  Drive with the stance-side thigh and buttocks to return to standing.    Goblet squat  Grip the kettlebell bell side up. Slowly squat down as low as you can go keeping your elbows on the inside of your knees. Return to standing.

## 2015-08-02 ENCOUNTER — Ambulatory Visit: Payer: BLUE CROSS/BLUE SHIELD | Admitting: Physical Therapy

## 2015-08-07 ENCOUNTER — Ambulatory Visit: Payer: BLUE CROSS/BLUE SHIELD | Admitting: Physical Therapy

## 2015-08-07 ENCOUNTER — Encounter: Payer: BLUE CROSS/BLUE SHIELD | Admitting: Physical Therapy

## 2015-08-09 ENCOUNTER — Encounter: Payer: BLUE CROSS/BLUE SHIELD | Admitting: Physical Therapy

## 2015-08-14 ENCOUNTER — Encounter: Payer: BLUE CROSS/BLUE SHIELD | Admitting: Physical Therapy

## 2015-08-16 ENCOUNTER — Encounter: Payer: BLUE CROSS/BLUE SHIELD | Admitting: Physical Therapy

## 2015-11-28 ENCOUNTER — Other Ambulatory Visit: Payer: Self-pay | Admitting: Obstetrics and Gynecology

## 2015-11-28 DIAGNOSIS — Z1231 Encounter for screening mammogram for malignant neoplasm of breast: Secondary | ICD-10-CM

## 2015-12-25 ENCOUNTER — Other Ambulatory Visit: Payer: Self-pay | Admitting: Obstetrics and Gynecology

## 2015-12-25 ENCOUNTER — Ambulatory Visit
Admission: RE | Admit: 2015-12-25 | Discharge: 2015-12-25 | Disposition: A | Discharge status: Home / Self Care | Payer: BLUE CROSS/BLUE SHIELD | Source: Ambulatory Visit | Attending: Obstetrics and Gynecology | Admitting: Obstetrics and Gynecology

## 2015-12-25 DIAGNOSIS — Z1231 Encounter for screening mammogram for malignant neoplasm of breast: Secondary | ICD-10-CM

## 2016-12-03 ENCOUNTER — Other Ambulatory Visit: Payer: Self-pay | Admitting: Nurse Practitioner

## 2016-12-03 DIAGNOSIS — Z1239 Encounter for other screening for malignant neoplasm of breast: Secondary | ICD-10-CM

## 2016-12-29 ENCOUNTER — Ambulatory Visit
Admission: RE | Admit: 2016-12-29 | Discharge: 2016-12-29 | Disposition: A | Discharge status: Home / Self Care | Payer: BLUE CROSS/BLUE SHIELD | Source: Ambulatory Visit | Attending: Nurse Practitioner | Admitting: Nurse Practitioner

## 2016-12-29 DIAGNOSIS — Z1239 Encounter for other screening for malignant neoplasm of breast: Secondary | ICD-10-CM

## 2016-12-29 DIAGNOSIS — Z1231 Encounter for screening mammogram for malignant neoplasm of breast: Secondary | ICD-10-CM | POA: Diagnosis not present

## 2017-12-09 ENCOUNTER — Other Ambulatory Visit: Payer: Self-pay | Admitting: Nurse Practitioner

## 2017-12-09 DIAGNOSIS — Z1231 Encounter for screening mammogram for malignant neoplasm of breast: Secondary | ICD-10-CM

## 2018-01-13 ENCOUNTER — Ambulatory Visit
Admission: RE | Admit: 2018-01-13 | Discharge: 2018-01-13 | Disposition: A | Discharge status: Home / Self Care | Payer: BLUE CROSS/BLUE SHIELD | Source: Ambulatory Visit | Attending: Nurse Practitioner | Admitting: Nurse Practitioner

## 2018-01-13 DIAGNOSIS — Z1231 Encounter for screening mammogram for malignant neoplasm of breast: Secondary | ICD-10-CM | POA: Diagnosis not present

## 2018-04-30 ENCOUNTER — Encounter: Payer: Self-pay | Admitting: *Deleted

## 2018-05-03 ENCOUNTER — Ambulatory Visit: Payer: BLUE CROSS/BLUE SHIELD | Admitting: Registered Nurse

## 2018-05-03 ENCOUNTER — Ambulatory Visit
Admission: RE | Admit: 2018-05-03 | Discharge: 2018-05-03 | Disposition: A | Discharge status: Home / Self Care | Payer: BLUE CROSS/BLUE SHIELD | Source: Ambulatory Visit | Attending: Gastroenterology | Admitting: Gastroenterology

## 2018-05-03 ENCOUNTER — Encounter: Payer: Self-pay | Admitting: *Deleted

## 2018-05-03 ENCOUNTER — Encounter
Admission: RE | Disposition: A | Discharge status: Home / Self Care | Payer: Self-pay | Source: Ambulatory Visit | Attending: Gastroenterology

## 2018-05-03 DIAGNOSIS — Z6831 Body mass index (BMI) 31.0-31.9, adult: Secondary | ICD-10-CM | POA: Diagnosis not present

## 2018-05-03 DIAGNOSIS — Z1211 Encounter for screening for malignant neoplasm of colon: Secondary | ICD-10-CM | POA: Diagnosis present

## 2018-05-03 DIAGNOSIS — Z7951 Long term (current) use of inhaled steroids: Secondary | ICD-10-CM | POA: Insufficient documentation

## 2018-05-03 DIAGNOSIS — K5730 Diverticulosis of large intestine without perforation or abscess without bleeding: Secondary | ICD-10-CM | POA: Diagnosis not present

## 2018-05-03 DIAGNOSIS — E039 Hypothyroidism, unspecified: Secondary | ICD-10-CM | POA: Diagnosis not present

## 2018-05-03 DIAGNOSIS — E669 Obesity, unspecified: Secondary | ICD-10-CM | POA: Diagnosis not present

## 2018-05-03 DIAGNOSIS — D122 Benign neoplasm of ascending colon: Secondary | ICD-10-CM | POA: Diagnosis not present

## 2018-05-03 DIAGNOSIS — Z79899 Other long term (current) drug therapy: Secondary | ICD-10-CM | POA: Insufficient documentation

## 2018-05-03 DIAGNOSIS — Z79890 Hormone replacement therapy (postmenopausal): Secondary | ICD-10-CM | POA: Diagnosis not present

## 2018-05-03 DIAGNOSIS — J45909 Unspecified asthma, uncomplicated: Secondary | ICD-10-CM | POA: Insufficient documentation

## 2018-05-03 DIAGNOSIS — E785 Hyperlipidemia, unspecified: Secondary | ICD-10-CM | POA: Insufficient documentation

## 2018-05-03 DIAGNOSIS — I10 Essential (primary) hypertension: Secondary | ICD-10-CM | POA: Insufficient documentation

## 2018-05-03 HISTORY — DX: Unspecified osteoarthritis, unspecified site: M19.90

## 2018-05-03 HISTORY — DX: Hypothyroidism, unspecified: E03.9

## 2018-05-03 HISTORY — DX: Unspecified asthma, uncomplicated: J45.909

## 2018-05-03 HISTORY — PX: COLONOSCOPY WITH PROPOFOL: SHX5780

## 2018-05-03 HISTORY — DX: Obesity, unspecified: E66.9

## 2018-05-03 HISTORY — DX: Essential (primary) hypertension: I10

## 2018-05-03 HISTORY — DX: Hyperlipidemia, unspecified: E78.5

## 2018-05-03 HISTORY — DX: Other specified disorders of bone density and structure, unspecified site: M85.80

## 2018-05-03 SURGERY — COLONOSCOPY WITH PROPOFOL
Anesthesia: General

## 2018-05-03 MED ORDER — MIDAZOLAM HCL 2 MG/2ML IJ SOLN
INTRAMUSCULAR | Status: AC
  Filled 2018-05-03: qty 2

## 2018-05-03 MED ORDER — PROPOFOL 10 MG/ML IV BOLUS
INTRAVENOUS | Status: DC | PRN
  Administered 2018-05-03: 90 mg via INTRAVENOUS

## 2018-05-03 MED ORDER — MIDAZOLAM HCL 2 MG/2ML IJ SOLN
INTRAMUSCULAR | Status: DC | PRN
  Administered 2018-05-03: 2 mg via INTRAVENOUS

## 2018-05-03 MED ORDER — PROPOFOL 500 MG/50ML IV EMUL
INTRAVENOUS | Status: DC | PRN
  Administered 2018-05-03: 150 ug/kg/min via INTRAVENOUS

## 2018-05-03 MED ORDER — PROPOFOL 10 MG/ML IV BOLUS
INTRAVENOUS | Status: AC
  Filled 2018-05-03: qty 20

## 2018-05-03 MED ORDER — LIDOCAINE HCL (CARDIAC) PF 100 MG/5ML IV SOSY
PREFILLED_SYRINGE | INTRAVENOUS | Status: DC | PRN
  Administered 2018-05-03: 40 mg via INTRAVENOUS

## 2018-05-03 MED ORDER — SODIUM CHLORIDE 0.9 % IV SOLN
INTRAVENOUS | Status: DC
  Administered 2018-05-03: 13:00:00 via INTRAVENOUS

## 2018-05-03 NOTE — H&P (Signed)
Outpatient short stay form Pre-procedure 05/03/2018 1:08 PM Catherine Sails MD  Primary Physician: Gaetano Net, NP  Reason for visit: Colonoscopy  History of present illness: Patient is a 62 year old female presenting for a colonoscopy in regards to screening.  Her last colonoscopy was about 10 years ago.  He tolerated her prep well.  She takes no aspirin or blood thinning agent.    Current Facility-Administered Medications:  .  0.9 %  sodium chloride infusion, , Intravenous, Continuous, Catherine Sails, MD, Last Rate: 20 mL/hr at 05/03/18 1308  Medications Prior to Admission  Medication Sig Dispense Refill Last Dose  . fluticasone-salmeterol (ADVAIR HFA) 45-21 MCG/ACT inhaler Inhale 2 puffs into the lungs 2 (two) times daily.   05/02/2018 at Unknown time  . levothyroxine (SYNTHROID, LEVOTHROID) 100 MCG tablet Take 100 mcg by mouth daily before breakfast.   05/02/2018 at Unknown time  . losartan (COZAAR) 25 MG tablet Take 25 mg by mouth daily.   05/02/2018 at Unknown time  . montelukast (SINGULAIR) 10 MG tablet 1 TABLET, ORAL, DAILY 90 tablet 1 05/02/2018 at Unknown time  . simvastatin (ZOCOR) 10 MG tablet 1 (ONE) TABLET, ORAL, EVERY OTHER DAY 90 tablet 3 05/02/2018 at Unknown time     No Known Allergies   Past Medical History:  Diagnosis Date  . Arthritis   . Asthma   . Hyperlipidemia   . Hypertension   . Hypothyroidism   . Obesity   . Osteopenia     Review of systems:      Physical Exam    Heart and lungs: Regular rate and rhythm without rub or gallop, lungs are bilaterally clear    HEENT: Normocephalic atraumatic eyes are anicteric    Other:    Pertinant exam for procedure: Soft nontender nondistended bowel sounds positive normoactive    Planned proceedures: Colonoscopy and indicated procedures. I have discussed the risks benefits and complications of procedures to include not limited to bleeding, infection, perforation and the risk of sedation and the  patient wishes to proceed.    Catherine Sails, MD Gastroenterology 05/03/2018  1:08 PM

## 2018-05-03 NOTE — Anesthesia Procedure Notes (Signed)
Date/Time: 05/03/2018 1:19 PM Performed by: Doreen Salvage, CRNA Pre-anesthesia Checklist: Patient identified, Emergency Drugs available, Suction available and Patient being monitored Patient Re-evaluated:Patient Re-evaluated prior to induction Oxygen Delivery Method: Nasal cannula Induction Type: IV induction Dental Injury: Teeth and Oropharynx as per pre-operative assessment  Comments: Nasal cannula with etCO2 monitoring

## 2018-05-03 NOTE — Transfer of Care (Signed)
Immediate Anesthesia Transfer of Care Note  Patient: Catherine Mcclain  Procedure(s) Performed: Procedure(s): COLONOSCOPY WITH PROPOFOL (N/A)  Patient Location: PACU and Endoscopy Unit  Anesthesia Type:General  Level of Consciousness: sedated  Airway & Oxygen Therapy: Patient Spontanous Breathing and Patient connected to nasal cannula oxygen  Post-op Assessment: Report given to RN and Post -op Vital signs reviewed and stable  Post vital signs: Reviewed and stable  Last Vitals:  Vitals:   05/03/18 1253 05/03/18 1340  BP: (!) 149/80 (!) 99/52  Pulse: 82 83  Resp: 16 19  Temp: (!) 36.3 C (!) 36.3 C  SpO2: 599% 774%    Complications: No apparent anesthesia complications

## 2018-05-03 NOTE — Op Note (Addendum)
[facility] Gastroenterology Patient Name: Catherine Mcclain Procedure Date: 05/03/2018 1:17 PM MRN: [id number] Account #: [id number] Date of Birth: 27-Jul-1955 Admit Type: Outpatient Age: 62 Room: [facility] ENDO ROOM 1 Gender: Female Note Status: Finalized Procedure:            Colonoscopy Indications:          Screening for colorectal malignant neoplasm Providers:            Lollie Sails, MD Referring MD:         Juluis Rainier (Referring MD) Medicines:            Monitored Anesthesia Care Complications:        No immediate complications. Procedure:            Pre-Anesthesia Assessment:                       - ASA Grade Assessment: II - A patient with mild                        systemic disease.                       After obtaining informed consent, the colonoscope was                        passed under direct vision. Throughout the procedure,                        the patient's blood pressure, pulse, and oxygen                        saturations were monitored continuously. The                        Colonoscope was introduced through the anus and                        advanced to the the cecum, identified by appendiceal                        orifice and ileocecal valve. The colonoscopy was                        performed without difficulty. The patient tolerated the                        procedure well. The quality of the bowel preparation                        was good. Findings:      Multiple small-mouthed diverticula were found in the sigmoid colon and       descending colon.      A 1 mm polyp was found in the ascending colon. The polyp was sessile.       The polyp was removed with a cold biopsy forceps. Resection and       retrieval were complete.      The digital rectal exam was normal.      The retroflexed view of the distal rectum and anal verge was normal and       showed no anal or rectal abnormalities. Impression:           -  Diverticulosis in the sigmoid colon and in the                        descending colon.                       - One 1 mm polyp in the ascending colon, removed with a                        cold biopsy forceps. Resected and retrieved.                       - The distal rectum and anal verge are normal on                        retroflexion view. Recommendation:       - Await pathology results. Procedure Code(s):    --- Professional ---                       [phone], Colonoscopy, flexible; with biopsy, single or                        multiple Diagnosis Code(s):    --- Professional ---                       Z12.11, Encounter for screening for malignant neoplasm                        of colon                       D12.2, Benign neoplasm of ascending colon                       K57.30, Diverticulosis of large intestine without                        perforation or abscess without bleeding CPT copyright 2018 American Medical Association. All rights reserved. The codes documented in this report are preliminary and upon coder review may  be revised to meet current compliance requirements. Lollie Sails, MD 05/03/2018 1:39:00 PM This report has been signed electronically. Number of Addenda: 0 Note Initiated On: 05/03/2018 1:17 PM Scope Withdrawal Time: 0 hours 9 minutes 59 seconds  Total Procedure Duration: 0 hours 15 minutes 30 seconds       [facility]

## 2018-05-03 NOTE — Anesthesia Preprocedure Evaluation (Signed)
Anesthesia Evaluation  Patient identified by MRN, date of birth, ID band Patient awake    Reviewed: Allergy & Precautions, H&P , NPO status , Patient's Chart, lab work & pertinent test results, reviewed documented beta blocker date and time   History of Anesthesia Complications Negative for: history of anesthetic complications  Airway Mallampati: I  TM Distance: >3 FB Neck ROM: full    Dental  (+) Dental Advidsory Given, Caps, Teeth Intact, Missing   Pulmonary neg shortness of breath, asthma , neg COPD, neg recent URI,           Cardiovascular Exercise Tolerance: Good hypertension, (-) angina(-) CAD, (-) Past MI, (-) Cardiac Stents and (-) CABG (-) dysrhythmias (-) Valvular Problems/Murmurs     Neuro/Psych negative neurological ROS  negative psych ROS   GI/Hepatic negative GI ROS, Neg liver ROS,   Endo/Other  neg diabetesHypothyroidism   Renal/GU negative Renal ROS  negative genitourinary   Musculoskeletal   Abdominal   Peds  Hematology negative hematology ROS (+)   Anesthesia Other Findings Past Medical History: No date: Arthritis No date: Asthma No date: Hyperlipidemia No date: Hypertension No date: Hypothyroidism No date: Obesity No date: Osteopenia   Reproductive/Obstetrics negative OB ROS                             Anesthesia Physical Anesthesia Plan  ASA: II  Anesthesia Plan: General   Post-op Pain Management:    Induction: Intravenous  PONV Risk Score and Plan: 3 and Propofol infusion and TIVA  Airway Management Planned: Natural Airway and Nasal Cannula  Additional Equipment:   Intra-op Plan:   Post-operative Plan:   Informed Consent: I have reviewed the patients History and Physical, chart, labs and discussed the procedure including the risks, benefits and alternatives for the proposed anesthesia with the patient or authorized representative who has indicated  his/her understanding and acceptance.   Dental Advisory Given  Plan Discussed with: Anesthesiologist, CRNA and Surgeon  Anesthesia Plan Comments:         Anesthesia Quick Evaluation

## 2018-05-03 NOTE — Anesthesia Post-op Follow-up Note (Signed)
Anesthesia QCDR form completed.        

## 2018-05-04 ENCOUNTER — Encounter: Payer: Self-pay | Admitting: Gastroenterology

## 2018-05-04 NOTE — Anesthesia Postprocedure Evaluation (Signed)
Anesthesia Post Note  Patient: Catherine Mcclain  Procedure(s) Performed: COLONOSCOPY WITH PROPOFOL (N/A )  Patient location during evaluation: Endoscopy Anesthesia Type: General Level of consciousness: awake and alert Pain management: pain level controlled Vital Signs Assessment: post-procedure vital signs reviewed and stable Respiratory status: spontaneous breathing, nonlabored ventilation, respiratory function stable and patient connected to nasal cannula oxygen Cardiovascular status: blood pressure returned to baseline and stable Postop Assessment: no apparent nausea or vomiting Anesthetic complications: no     Last Vitals:  Vitals:   05/03/18 1400 05/03/18 1410  BP: 134/73 138/73  Pulse: 79 81  Resp: 16 14  Temp:    SpO2: 97% 98%    Last Pain:  Vitals:   05/04/18 0736  TempSrc:   PainSc: 0-No pain                 Martha Clan

## 2018-05-05 LAB — SURGICAL PATHOLOGY

## 2018-06-02 ENCOUNTER — Other Ambulatory Visit: Payer: Self-pay | Admitting: Specialist

## 2018-06-07 ENCOUNTER — Encounter
Admission: RE | Admit: 2018-06-07 | Discharge: 2018-06-07 | Disposition: A | Discharge status: Home / Self Care | Payer: BLUE CROSS/BLUE SHIELD | Source: Ambulatory Visit | Attending: Specialist | Admitting: Specialist

## 2018-06-07 ENCOUNTER — Other Ambulatory Visit: Payer: Self-pay

## 2018-06-07 HISTORY — DX: Anemia, unspecified: D64.9

## 2018-06-07 NOTE — Patient Instructions (Signed)
Your procedure is scheduled on: 06-14-18 MONDAY Report to Same Day Surgery 2nd floor medical mall [facility] Entrance-take elevator on left to 2nd floor.  Check in with surgery information desk.) To find out your arrival time please call [phone] between 1PM - 3PM on  07-11-18 FRIDAY  Remember: Instructions that are not followed completely may result in serious medical risk, up to and including death, or upon the discretion of your surgeon and anesthesiologist your surgery may need to be rescheduled.    _x___ 1. Do not eat food after midnight the night before your procedure. NO GUM OR CANDY AFTER MIDNIGHT.  You may drink clear liquids up to 2 hours before you are scheduled to arrive at the hospital for your procedure.  Do not drink clear liquids within 2 hours of your scheduled arrival to the hospital.  Clear liquids include  --Water or Apple juice without pulp  --Clear carbohydrate beverage such as ClearFast or Gatorade  --Black Coffee or Clear Tea (No milk, no creamers, do not add anything to the coffee or Tea   ____Ensure clear carbohydrate drink on the way to the hospital for bariatric patients  ____Ensure clear carbohydrate drink 3 hours before surgery for Dr Dwyane Luo patients if physician instructed.     __x__ 2. No Alcohol for 24 hours before or after surgery.   __x__3. No Smoking or e-cigarettes for 24 prior to surgery.  Do not use any chewable tobacco products for at least 6 hour prior to surgery   ____  4. Bring all medications with you on the day of surgery if instructed.    __x__ 5. Notify your doctor if there is any change in your medical condition     (cold, fever, infections).    x___6. On the morning of surgery brush your teeth with toothpaste and water.  You may rinse your mouth with mouth wash if you wish.  Do not swallow any toothpaste or mouthwash.   Do not wear jewelry, make-up, hairpins, clips or nail polish.  Do not wear lotions, powders, or perfumes.  You may wear deodorant.  Do not shave 48 hours prior to surgery. Men may shave face and neck.  Do not bring valuables to the hospital.    [facility] is not responsible for any belongings or valuables.               Contacts, dentures or bridgework may not be worn into surgery.  Leave your suitcase in the car. After surgery it may be brought to your room.  For patients admitted to the hospital, discharge time is determined by your treatment team.  _  Patients discharged the day of surgery will not be allowed to drive home.  You will need someone to drive you home and stay with you the night of your procedure.    Please read over the following fact sheets that you were given:   [facility] Preparing for Surgery   _x___ TAKE THE FOLLOWING MEDICATION THE MORNING OF SURGERY WITH A SMALL SIP OF WATER. These include:  1. ZYRTEC   2. SINGULAIR  3. LEVOTHYROXINE  4. ZOCOR  5.  6.  ____Fleets enema or Magnesium Citrate as directed.   _x___ Use CHG Soap or sage wipes as directed on instruction sheet   _X___ Use inhalers on the day of surgery and bring to hospital day of surgery-USE YOUR ALBUTEROL AND ADVAIR [place]  ____ Stop  Metformin and Janumet 2 days prior to surgery.    ____ Take 1/2 of usual insulin dose the night before surgery and none on the morning surgery.   ____ Follow recommendations from Cardiologist, Pulmonologist or PCP regarding stopping Aspirin, Coumadin, Plavix ,Eliquis, Effient, or Pradaxa, and Pletal.  X____Stop Anti-inflammatories such as Advil, Aleve, Ibuprofen, Motrin, Naproxen, MELOXICAM, Naprosyn, Goodies powders or aspirin products NOW-OK to take Tylenol    _x___ Stop supplements until after surgery-STOP FISH OIL AND VITAMIN C NOW-MAY RESUME AFTER SURGERY   ____ Bring C-Pap to the hospital.

## 2018-06-09 ENCOUNTER — Encounter
Admission: RE | Admit: 2018-06-09 | Discharge: 2018-06-09 | Disposition: A | Discharge status: Home / Self Care | Payer: BLUE CROSS/BLUE SHIELD | Source: Ambulatory Visit | Attending: Specialist | Admitting: Specialist

## 2018-06-09 DIAGNOSIS — I10 Essential (primary) hypertension: Secondary | ICD-10-CM | POA: Diagnosis not present

## 2018-06-09 DIAGNOSIS — Z01810 Encounter for preprocedural cardiovascular examination: Secondary | ICD-10-CM | POA: Diagnosis present

## 2018-06-14 ENCOUNTER — Ambulatory Visit: Admission: RE | Admit: 2018-06-14 | Payer: BLUE CROSS/BLUE SHIELD | Source: Ambulatory Visit | Admitting: Specialist

## 2018-06-14 ENCOUNTER — Encounter: Admission: RE | Payer: Self-pay | Source: Ambulatory Visit

## 2018-06-14 SURGERY — ARTHROSCOPY, SHOULDER WITH REPAIR, ROTATOR CUFF, OPEN
Anesthesia: Choice | Laterality: Right

## 2018-07-28 ENCOUNTER — Other Ambulatory Visit: Payer: Self-pay | Admitting: Specialist

## 2018-08-01 MED ORDER — CEFAZOLIN SODIUM-DEXTROSE 2-4 GM/100ML-% IV SOLN
2.0000 g | INTRAVENOUS | Status: AC
  Administered 2018-08-02: 2000 mg via INTRAVENOUS

## 2018-08-01 MED ORDER — CLINDAMYCIN PHOSPHATE 600 MG/50ML IV SOLN
600.0000 mg | INTRAVENOUS | Status: AC
  Administered 2018-08-02: 600 mg via INTRAVENOUS

## 2018-08-02 ENCOUNTER — Encounter
Admission: RE | Disposition: A | Discharge status: Home / Self Care | Payer: Self-pay | Source: Home / Self Care | Attending: Specialist

## 2018-08-02 ENCOUNTER — Ambulatory Visit: Payer: BLUE CROSS/BLUE SHIELD | Admitting: Anesthesiology

## 2018-08-02 ENCOUNTER — Other Ambulatory Visit: Payer: Self-pay

## 2018-08-02 ENCOUNTER — Ambulatory Visit
Admission: RE | Admit: 2018-08-02 | Discharge: 2018-08-02 | Disposition: A | Discharge status: Home / Self Care | Payer: BLUE CROSS/BLUE SHIELD | Attending: Specialist | Admitting: Specialist

## 2018-08-02 DIAGNOSIS — M7541 Impingement syndrome of right shoulder: Secondary | ICD-10-CM | POA: Diagnosis not present

## 2018-08-02 DIAGNOSIS — E039 Hypothyroidism, unspecified: Secondary | ICD-10-CM | POA: Diagnosis not present

## 2018-08-02 DIAGNOSIS — J45909 Unspecified asthma, uncomplicated: Secondary | ICD-10-CM | POA: Diagnosis not present

## 2018-08-02 DIAGNOSIS — E785 Hyperlipidemia, unspecified: Secondary | ICD-10-CM | POA: Diagnosis not present

## 2018-08-02 DIAGNOSIS — Z79899 Other long term (current) drug therapy: Secondary | ICD-10-CM | POA: Diagnosis not present

## 2018-08-02 DIAGNOSIS — I10 Essential (primary) hypertension: Secondary | ICD-10-CM | POA: Insufficient documentation

## 2018-08-02 DIAGNOSIS — M75101 Unspecified rotator cuff tear or rupture of right shoulder, not specified as traumatic: Secondary | ICD-10-CM | POA: Diagnosis present

## 2018-08-02 HISTORY — PX: SHOULDER ARTHROSCOPY WITH ROTATOR CUFF REPAIR: SHX5685

## 2018-08-02 SURGERY — ARTHROSCOPY, SHOULDER, WITH ROTATOR CUFF REPAIR
Anesthesia: General | Site: Shoulder | Laterality: Right

## 2018-08-02 MED ORDER — FENTANYL CITRATE (PF) 100 MCG/2ML IJ SOLN: 25.0000 ug | INTRAMUSCULAR | Status: DC | PRN

## 2018-08-02 MED ORDER — CEFAZOLIN SODIUM-DEXTROSE 2-4 GM/100ML-% IV SOLN: 2.0000 g | INTRAVENOUS | Status: DC

## 2018-08-02 MED ORDER — ROPIVACAINE HCL 5 MG/ML IJ SOLN
INTRAMUSCULAR | Status: AC
  Filled 2018-08-02: qty 30

## 2018-08-02 MED ORDER — BUPIVACAINE-EPINEPHRINE (PF) 0.25% -1:200000 IJ SOLN
INTRAMUSCULAR | Status: AC
  Filled 2018-08-02: qty 60

## 2018-08-02 MED ORDER — ONDANSETRON HCL 4 MG/2ML IJ SOLN
INTRAMUSCULAR | Status: DC | PRN
  Administered 2018-08-02: 4 mg via INTRAVENOUS

## 2018-08-02 MED ORDER — MIDAZOLAM HCL 2 MG/2ML IJ SOLN
INTRAMUSCULAR | Status: AC
  Administered 2018-08-02: 1 mg via INTRAVENOUS
  Filled 2018-08-02: qty 2

## 2018-08-02 MED ORDER — HYDROCODONE-ACETAMINOPHEN 7.5-325 MG/15ML PO SOLN: 15.0000 mL | Freq: Four times a day (QID) | ORAL | 0 refills | Status: DC | PRN

## 2018-08-02 MED ORDER — LIDOCAINE HCL (PF) 1 % IJ SOLN
INTRAMUSCULAR | Status: DC | PRN
  Administered 2018-08-02: 1 mL via INTRADERMAL

## 2018-08-02 MED ORDER — GABAPENTIN 300 MG PO CAPS: 300.0000 mg | ORAL_CAPSULE | ORAL | Status: DC

## 2018-08-02 MED ORDER — EPINEPHRINE 30 MG/30ML IJ SOLN
INTRAMUSCULAR | Status: AC
  Filled 2018-08-02: qty 1

## 2018-08-02 MED ORDER — GABAPENTIN 400 MG PO CAPS: 400.0000 mg | ORAL_CAPSULE | Freq: Three times a day (TID) | ORAL | 3 refills | Status: DC

## 2018-08-02 MED ORDER — FENTANYL CITRATE (PF) 100 MCG/2ML IJ SOLN
50.0000 ug | Freq: Once | INTRAMUSCULAR | Status: AC
  Administered 2018-08-02: 50 ug via INTRAVENOUS

## 2018-08-02 MED ORDER — FENTANYL CITRATE (PF) 100 MCG/2ML IJ SOLN
INTRAMUSCULAR | Status: AC
  Administered 2018-08-02: 50 ug via INTRAVENOUS
  Filled 2018-08-02: qty 2

## 2018-08-02 MED ORDER — OXYCODONE HCL 5 MG PO TABS: 5.0000 mg | ORAL_TABLET | Freq: Once | ORAL | Status: DC | PRN

## 2018-08-02 MED ORDER — ONDANSETRON HCL 4 MG/2ML IJ SOLN
INTRAMUSCULAR | Status: AC
  Filled 2018-08-02: qty 2

## 2018-08-02 MED ORDER — LIDOCAINE HCL (PF) 1 % IJ SOLN
INTRAMUSCULAR | Status: AC
  Filled 2018-08-02: qty 5

## 2018-08-02 MED ORDER — LACTATED RINGERS IV SOLN
INTRAVENOUS | Status: DC
  Administered 2018-08-02: 09:00:00 via INTRAVENOUS

## 2018-08-02 MED ORDER — MORPHINE SULFATE (PF) 4 MG/ML IV SOLN
INTRAVENOUS | Status: AC
  Filled 2018-08-02: qty 1

## 2018-08-02 MED ORDER — CLINDAMYCIN PHOSPHATE 600 MG/50ML IV SOLN
INTRAVENOUS | Status: AC
  Filled 2018-08-02: qty 50

## 2018-08-02 MED ORDER — PHENYLEPHRINE HCL 10 MG/ML IJ SOLN
INTRAMUSCULAR | Status: DC | PRN
  Administered 2018-08-02: 100 ug via INTRAVENOUS
  Administered 2018-08-02 (×2): 150 ug via INTRAVENOUS
  Administered 2018-08-02: 100 ug via INTRAVENOUS

## 2018-08-02 MED ORDER — CHLORHEXIDINE GLUCONATE CLOTH 2 % EX PADS: 6.0000 | MEDICATED_PAD | Freq: Once | CUTANEOUS | Status: DC

## 2018-08-02 MED ORDER — GABAPENTIN 300 MG PO CAPS
ORAL_CAPSULE | ORAL | Status: AC
  Filled 2018-08-02: qty 1

## 2018-08-02 MED ORDER — SUGAMMADEX SODIUM 200 MG/2ML IV SOLN
INTRAVENOUS | Status: AC
  Filled 2018-08-02: qty 2

## 2018-08-02 MED ORDER — DEXAMETHASONE SODIUM PHOSPHATE 10 MG/ML IJ SOLN
INTRAMUSCULAR | Status: DC | PRN
  Administered 2018-08-02: 5 mg via INTRAVENOUS

## 2018-08-02 MED ORDER — MELOXICAM 7.5 MG PO TABS: 15.0000 mg | ORAL_TABLET | ORAL | Status: DC

## 2018-08-02 MED ORDER — MIDAZOLAM HCL 2 MG/2ML IJ SOLN
INTRAMUSCULAR | Status: DC | PRN
  Administered 2018-08-02: 1 mg via INTRAVENOUS

## 2018-08-02 MED ORDER — FAMOTIDINE 20 MG PO TABS
ORAL_TABLET | ORAL | Status: AC
  Filled 2018-08-02: qty 1

## 2018-08-02 MED ORDER — CEFAZOLIN SODIUM-DEXTROSE 2-4 GM/100ML-% IV SOLN
INTRAVENOUS | Status: AC
  Filled 2018-08-02: qty 100

## 2018-08-02 MED ORDER — MIDAZOLAM HCL 2 MG/2ML IJ SOLN
1.0000 mg | Freq: Once | INTRAMUSCULAR | Status: AC
  Administered 2018-08-02: 1 mg via INTRAVENOUS

## 2018-08-02 MED ORDER — SODIUM CHLORIDE 0.9 % IV SOLN
INTRAVENOUS | Status: DC | PRN
  Administered 2018-08-02: 25 ug/min via INTRAVENOUS

## 2018-08-02 MED ORDER — ROCURONIUM BROMIDE 100 MG/10ML IV SOLN
INTRAVENOUS | Status: DC | PRN
  Administered 2018-08-02: 50 mg via INTRAVENOUS

## 2018-08-02 MED ORDER — FENTANYL CITRATE (PF) 100 MCG/2ML IJ SOLN
INTRAMUSCULAR | Status: DC | PRN
  Administered 2018-08-02: 100 ug via INTRAVENOUS

## 2018-08-02 MED ORDER — LIDOCAINE HCL (CARDIAC) PF 100 MG/5ML IV SOSY
PREFILLED_SYRINGE | INTRAVENOUS | Status: DC | PRN
  Administered 2018-08-02: 80 mg via INTRAVENOUS

## 2018-08-02 MED ORDER — FAMOTIDINE 20 MG PO TABS: 20.0000 mg | ORAL_TABLET | Freq: Once | ORAL | Status: DC

## 2018-08-02 MED ORDER — OXYCODONE HCL 5 MG/5ML PO SOLN: 5.0000 mg | Freq: Once | ORAL | Status: DC | PRN

## 2018-08-02 MED ORDER — DEXAMETHASONE SODIUM PHOSPHATE 10 MG/ML IJ SOLN
INTRAMUSCULAR | Status: AC
  Filled 2018-08-02: qty 1

## 2018-08-02 MED ORDER — FENTANYL CITRATE (PF) 250 MCG/5ML IJ SOLN
INTRAMUSCULAR | Status: AC
  Filled 2018-08-02: qty 5

## 2018-08-02 MED ORDER — CLINDAMYCIN PHOSPHATE 600 MG/50ML IV SOLN: 600.0000 mg | INTRAVENOUS | Status: DC

## 2018-08-02 MED ORDER — ROPIVACAINE HCL 5 MG/ML IJ SOLN
INTRAMUSCULAR | Status: DC | PRN
  Administered 2018-08-02: 10 mL via PERINEURAL
  Administered 2018-08-02: 20 mL via PERINEURAL

## 2018-08-02 MED ORDER — MIDAZOLAM HCL 2 MG/2ML IJ SOLN
INTRAMUSCULAR | Status: AC
  Filled 2018-08-02: qty 2

## 2018-08-02 MED ORDER — PROPOFOL 10 MG/ML IV BOLUS
INTRAVENOUS | Status: DC | PRN
  Administered 2018-08-02: 150 mg via INTRAVENOUS

## 2018-08-02 MED ORDER — MELOXICAM 7.5 MG PO TABS
ORAL_TABLET | ORAL | Status: AC
  Administered 2018-08-02: 15 mg via ORAL
  Filled 2018-08-02: qty 2

## 2018-08-02 MED ORDER — ROCURONIUM BROMIDE 50 MG/5ML IV SOLN
INTRAVENOUS | Status: AC
  Filled 2018-08-02: qty 1

## 2018-08-02 MED ORDER — MELOXICAM 7.5 MG PO TABS
15.0000 mg | ORAL_TABLET | ORAL | Status: AC
  Administered 2018-08-02: 15 mg via ORAL

## 2018-08-02 MED ORDER — GABAPENTIN 300 MG PO CAPS
300.0000 mg | ORAL_CAPSULE | ORAL | Status: AC
  Administered 2018-08-02: 300 mg via ORAL

## 2018-08-02 MED ORDER — PROPOFOL 10 MG/ML IV BOLUS
INTRAVENOUS | Status: AC
  Filled 2018-08-02: qty 20

## 2018-08-02 MED ORDER — GABAPENTIN 300 MG PO CAPS
ORAL_CAPSULE | ORAL | Status: AC
  Administered 2018-08-02: 300 mg via ORAL
  Filled 2018-08-02: qty 1

## 2018-08-02 MED ORDER — BUPIVACAINE-EPINEPHRINE (PF) 0.25% -1:200000 IJ SOLN
INTRAMUSCULAR | Status: DC | PRN
  Administered 2018-08-02 (×2): 30 mL via PERINEURAL

## 2018-08-02 MED ORDER — MELOXICAM 7.5 MG PO TABS
ORAL_TABLET | ORAL | Status: AC
  Filled 2018-08-02: qty 2

## 2018-08-02 MED ORDER — LIDOCAINE HCL (PF) 2 % IJ SOLN
INTRAMUSCULAR | Status: AC
  Filled 2018-08-02: qty 10

## 2018-08-02 SURGICAL SUPPLY — 52 items
ADAPTER IRRIG TUBE 2 SPIKE SOL (ADAPTER) ×3 IMPLANT
BLADE AGGRESSIVE PLUS 4.0 (BLADE) ×1 IMPLANT
BUR AGGRESSIVE+ 5.5 (BURR) ×1 IMPLANT
BUR BR 5.5 12 FLUTE (BURR) ×1 IMPLANT
BUR RADIUS 4.0X18.5 (BURR) ×2 IMPLANT
BUR RADIUS 5.5 (BURR) IMPLANT
CANNULA 5.75X7 CRYSTAL CLEAR (CANNULA) IMPLANT
CANNULA 8.5X75 THRED (CANNULA) IMPLANT
CANNULA PARTIAL THREAD 2X7 (CANNULA) IMPLANT
CHLORAPREP W/TINT 26ML (MISCELLANEOUS) ×2 IMPLANT
CONNECTOR PERFECT PASSER (CONNECTOR) ×1 IMPLANT
COVER MAYO STAND STRL (DRAPES) ×2 IMPLANT
COVER WAND RF STERILE (DRAPES) ×1 IMPLANT
DRAPE IMP U-DRAPE 54X76 (DRAPES) ×4 IMPLANT
DRAPE SHEET LG 3/4 BI-LAMINATE (DRAPES) IMPLANT
DRAPE STERI 35X30 U-POUCH (DRAPES) ×2 IMPLANT
GAUZE PETRO XEROFOAM 1X8 (MISCELLANEOUS) ×2 IMPLANT
GAUZE SPONGE 4X4 12PLY STRL (GAUZE/BANDAGES/DRESSINGS) ×4 IMPLANT
GLOVE SURG ORTHO 8.0 STRL STRW (GLOVE) ×2 IMPLANT
GOWN STRL REUS W/TWL LRG LVL4 (GOWN DISPOSABLE) ×2 IMPLANT
IV LACTATED RINGER IRRG 3000ML (IV SOLUTION) ×4
IV LR IRRIG 3000ML ARTHROMATIC (IV SOLUTION) ×4 IMPLANT
KIT SHOULDER TRACTION (DRAPES) ×2 IMPLANT
KIT TURNOVER KIT A (KITS) ×2 IMPLANT
MANIFOLD NEPTUNE II (INSTRUMENTS) ×2 IMPLANT
MAT ABSORB  FLUID 56X50 GRAY (MISCELLANEOUS) ×1
MAT ABSORB FLUID 56X50 GRAY (MISCELLANEOUS) ×1 IMPLANT
NDL SAFETY ECLIPSE 18X1.5 (NEEDLE) ×1 IMPLANT
NDL SPNL 18GX3.5 QUINCKE PK (NEEDLE) ×1 IMPLANT
NEEDLE HYPO 18GX1.5 SHARP (NEEDLE) ×1
NEEDLE SPNL 18GX3.5 QUINCKE PK (NEEDLE) ×2 IMPLANT
NS IRRIG 500ML POUR BTL (IV SOLUTION) ×1 IMPLANT
PACK ARTHROSCOPY SHOULDER (MISCELLANEOUS) ×2 IMPLANT
PASSER SUT CAPTURE FIRST (SUTURE) ×1 IMPLANT
SET TUBE SUCT SHAVER OUTFL 24K (TUBING) ×2 IMPLANT
SLING ULTRA II LG (MISCELLANEOUS) ×1 IMPLANT
SOL PREP PVP 2OZ (MISCELLANEOUS) ×2
SOLUTION PREP PVP 2OZ (MISCELLANEOUS) ×1 IMPLANT
SUT ETHILON 3 0 FSLX (SUTURE) ×2 IMPLANT
SUT PDS PLUS 0 (SUTURE)
SUT PDS PLUS AB 0 CT-2 (SUTURE) ×1 IMPLANT
SUT PERFECTPASSER WHITE CART (SUTURE) ×1 IMPLANT
SUT VIC AB 2-0 CT2 27 (SUTURE) ×2 IMPLANT
SUT VICRYL 3-0 27IN (SUTURE) ×2 IMPLANT
SUTURE MAGNUM WIRE 2X48 BLK (SUTURE) ×1 IMPLANT
SYR 20CC LL (SYRINGE) ×2 IMPLANT
SYR 30ML LL (SYRINGE) ×2 IMPLANT
SYR 50ML LL SCALE MARK (SYRINGE) ×2 IMPLANT
TUBING ARTHRO INFLOW-ONLY STRL (TUBING) ×2 IMPLANT
TUBING CONNECTING 10 (TUBING) ×2 IMPLANT
WAND COBLATION FLOW 50 (SURGICAL WAND) IMPLANT
WAND WEREWOLF FLOW 90D (MISCELLANEOUS) ×2 IMPLANT

## 2018-08-02 NOTE — Anesthesia Post-op Follow-up Note (Signed)
Anesthesia QCDR form completed.        

## 2018-08-02 NOTE — Transfer of Care (Signed)
Immediate Anesthesia Transfer of Care Note  Patient: Catherine Mcclain  Procedure(s) Performed: SHOULDER ARTHROSCOPY , subacromial deompression, distal clavicle excision, biceps release (Right Shoulder)  Patient Location: PACU  Anesthesia Type:General  Level of Consciousness: drowsy  Airway & Oxygen Therapy: Patient Spontanous Breathing and Patient connected to face mask oxygen  Post-op Assessment: Report given to RN  Post vital signs: Reviewed and stable  Last Vitals:  Vitals Value Taken Time  BP 143/66 08/02/2018  1:35 PM  Temp    Pulse 91 08/02/2018  1:38 PM  Resp 17 08/02/2018  1:38 PM  SpO2 99 % 08/02/2018  1:38 PM  Vitals shown include unvalidated device data.  Last Pain:  Vitals:   08/02/18 0821  TempSrc: Temporal  PainSc: 0-No pain         Complications: No apparent anesthesia complications

## 2018-08-02 NOTE — Op Note (Signed)
08/02/2018  1:29 PM  PATIENT:  Catherine Mcclain    PRE-OPERATIVE DIAGNOSIS:  IMPINGEMENT SYNDROME OF RIGHT SHOULDER REGION  POST-OPERATIVE DIAGNOSIS:  Same  PROCEDURE:  SHOULDER ARTHROSCOPY , subacromial deompression, distal clavicle excision, biceps release  SURGEON:  Park Breed, MD  ANESTHESIA:   General  PREOPERATIVE INDICATIONS:  Catherine Mcclain is a  63 y.o. female with a diagnosis of IMPINGEMENT SYNDROME OF RIGHT SHOULDER REGION who failed conservative measures and elected for surgical management.    The risks benefits and alternatives were discussed with the patient preoperatively including but not limited to the risks of infection, bleeding, nerve injury, cardiopulmonary complications, the need for revision surgery, among others, and the patient was willing to proceed.  EBL: 10cc   OPERATIVE FINDINGS: There was some fraying in the glenohumeral joint with anterior labral fraying.  The biceps tendon had minimal changes.  Glenohumeral joint was smooth.  There is slight fraying at the undersurface of the insertion of the supraspinatus.  The subacromial bursa shows significant inflammatory changes with thickened bursal tissue and debris.  The bursal side of the cuff was intact.  Narrowing of the AC joint.  OPERATIVE PROCEDURE: The patient was brought to the operating room where satisfactory general endotracheal and interscalene anesthesia were accomplished.The patient was turned into the lateral decubitus position and the shoulder was prepped and draped in a sterile fashion. Arthroscopy was carried out from a posterior portal with accessory portals laterally and anteriorly. The  joint was examined first. The above findings were encountered. The motorized shaver was introduced anteriorly and the undersurface of the rotator cuff probed and lightly debrided. The biceps tendon was released. The labrum was trimmed up with the ArthroCare wand. The arthroscope was redirected into the  subacromial space. There was severe bursitis which was resected with the motorized shaver and ArthroCare wand. The large bur was introduced from a posterior portal and the anterior acromion was debrided. The undersurface of the clavicle was debrided with the bur which was then reintroduced from an anterior portal and the remaining distal clavicle completely excised.   Final debridement was carried out with the motorized shaver and the ArthroCare wand. The joint was flushed and the stab wounds and closed with 3-0 nylon suture. 1/4% Marcaine with morphine was injected into the shoulder. Sponge and needle counts were correct.   The dry sterile dressing and was applied along with a  padded sling. Patient was awakened and taken recovery in good condition.  Park Breed, MD

## 2018-08-02 NOTE — H&P (Signed)
THE PATIENT WAS SEEN PRIOR TO SURGERY TODAY.  HISTORY, ALLERGIES, HOME MEDICATIONS AND OPERATIVE PROCEDURE WERE REVIEWED. RISKS AND BENEFITS OF SURGERY DISCUSSED WITH PATIENT AGAIN.  NO CHANGES FROM INITIAL HISTORY AND PHYSICAL NOTED.    

## 2018-08-02 NOTE — Anesthesia Preprocedure Evaluation (Signed)
Anesthesia Evaluation  Patient identified by MRN, date of birth, ID band Patient awake    Reviewed: Allergy & Precautions, H&P , NPO status , Patient's Chart, lab work & pertinent test results  History of Anesthesia Complications Negative for: history of anesthetic complications  Airway Mallampati: III  TM Distance: <3 FB Neck ROM: limited    Dental  (+) Chipped   Pulmonary neg pulmonary ROS, neg shortness of breath, asthma , Recent URI ,           Cardiovascular Exercise Tolerance: Good hypertension, (-) angina(-) Past MI and (-) DOE      Neuro/Psych negative neurological ROS  negative psych ROS   GI/Hepatic negative GI ROS, Neg liver ROS, neg GERD  ,  Endo/Other  Hypothyroidism   Renal/GU      Musculoskeletal  (+) Arthritis ,   Abdominal   Peds  Hematology negative hematology ROS (+)   Anesthesia Other Findings Past Medical History: No date: Anemia     Comment:  AS A TEENAGER No date: Arthritis No date: Asthma     Comment:  WELL CONTROLLED No date: Hyperlipidemia No date: Hypertension No date: Hypothyroidism No date: Obesity No date: Osteopenia  Past Surgical History: 10/2010: BREAST BIOPSY; Right     Comment:  NEG 05/03/2018: COLONOSCOPY WITH PROPOFOL; N/A     Comment:  Procedure: COLONOSCOPY WITH PROPOFOL;  Surgeon:               Lollie Sails, MD;  Location: [facility] ENDOSCOPY;                Service: Endoscopy;  Laterality: N/A; No date: FUNCTIONAL ENDOSCOPIC SINUS SURGERY No date: THYROID LOBECTOMY  BMI    Body Mass Index:  32.26 kg/m      Reproductive/Obstetrics negative OB ROS                             Anesthesia Physical Anesthesia Plan  ASA: III  Anesthesia Plan: General ETT   Post-op Pain Management: GA combined w/ Regional for post-op pain   Induction: Intravenous  PONV Risk Score and Plan: Ondansetron, Dexamethasone, Midazolam and Treatment may  vary due to age or medical condition  Airway Management Planned: Oral ETT  Additional Equipment:   Intra-op Plan:   Post-operative Plan: Extubation in OR  Informed Consent: I have reviewed the patients History and Physical, chart, labs and discussed the procedure including the risks, benefits and alternatives for the proposed anesthesia with the patient or authorized representative who has indicated his/her understanding and acceptance.     Dental Advisory Given  Plan Discussed with: Anesthesiologist, CRNA and Surgeon  Anesthesia Plan Comments: (Patient consented for risks of anesthesia including but not limited to:  - adverse reactions to medications - damage to teeth, lips or other oral mucosa - sore throat or hoarseness - Damage to heart, brain, lungs or loss of life  Patient voiced understanding.)        Anesthesia Quick Evaluation

## 2018-08-02 NOTE — Discharge Instructions (Signed)

## 2018-08-02 NOTE — Anesthesia Postprocedure Evaluation (Signed)
Anesthesia Post Note  Patient: Catherine Mcclain  Procedure(s) Performed: SHOULDER ARTHROSCOPY , subacromial deompression, distal clavicle excision, biceps release (Right Shoulder)  Patient location during evaluation: PACU Anesthesia Type: General Level of consciousness: awake and alert Pain management: pain level controlled Vital Signs Assessment: post-procedure vital signs reviewed and stable Respiratory status: spontaneous breathing, nonlabored ventilation, respiratory function stable and patient connected to nasal cannula oxygen Cardiovascular status: blood pressure returned to baseline and stable Postop Assessment: no apparent nausea or vomiting Anesthetic complications: no     Last Vitals:  Vitals:   08/02/18 1425 08/02/18 1428  BP:    Pulse: 79 78  Resp: 16 19  Temp:    SpO2: 92% 94%    Last Pain:  Vitals:   08/02/18 1420  TempSrc:   PainSc: 0-No pain                 Precious Haws Quiana Cobaugh

## 2018-08-02 NOTE — Anesthesia Procedure Notes (Signed)
Anesthesia Regional Block: Interscalene brachial plexus block   Pre-Anesthetic Checklist: ,, timeout performed, Correct Patient, Correct Site, Correct Laterality, Correct Procedure, Correct Position, site marked, Risks and benefits discussed,  Surgical consent,  Pre-op evaluation,  At surgeon's request and post-op pain management  Laterality: Upper and Right  Prep: chloraprep       Needles:  Injection technique: Single-shot  Needle Type: Stimiplex     Needle Length: 5cm  Needle Gauge: 22     Additional Needles:   Procedures:,,,, ultrasound used (permanent image in chart),,,,  Narrative:  Start time: 08/02/2018 8:54 AM End time: 08/02/2018 8:56 AM Injection made incrementally with aspirations every 5 mL.  Performed by: Personally  Anesthesiologist: Liberty Seto, Precious Haws, MD  Additional Notes: Patient consented for risk and benefits of nerve block including but not limited to nerve damage, failed block, bleeding and infection.  Patient voiced understanding.  Functioning IV was confirmed and monitors were applied.  A 38mm 22ga Stimuplex needle was used. Sterile prep,hand hygiene and sterile gloves were used.  Minimal sedation used for procedure.  No paresthesia endorsed by patient during the procedure.  Negative aspiration and negative test dose prior to incremental administration of local anesthetic. The patient tolerated the procedure well with no immediate complications.

## 2018-08-02 NOTE — Anesthesia Procedure Notes (Addendum)
Procedure Name: Intubation Date/Time: 08/02/2018 11:36 AM Performed by: Fredderick Phenix, CRNA Pre-anesthesia Checklist: Patient identified, Emergency Drugs available, Suction available, Patient being monitored and Timeout performed Patient Re-evaluated:Patient Re-evaluated prior to induction Oxygen Delivery Method: Circle system utilized Preoxygenation: Pre-oxygenation with 100% oxygen Induction Type: IV induction Ventilation: Mask ventilation without difficulty Laryngoscope Size: Mac and 3 Grade View: Grade II Tube type: Oral Tube size: 7.0 mm Number of attempts: 1 Airway Equipment and Method: Stylet Placement Confirmation: ETT inserted through vocal cords under direct vision,  positive ETCO2 and breath sounds checked- equal and bilateral Secured at: 21 cm Tube secured with: Tape

## 2018-08-03 ENCOUNTER — Encounter: Payer: Self-pay | Admitting: Specialist

## 2018-09-20 ENCOUNTER — Other Ambulatory Visit: Payer: Self-pay | Admitting: Specialist

## 2018-09-22 ENCOUNTER — Encounter
Admission: RE | Admit: 2018-09-22 | Discharge: 2018-09-22 | Disposition: A | Discharge status: Home / Self Care | Payer: BLUE CROSS/BLUE SHIELD | Source: Ambulatory Visit | Attending: Specialist | Admitting: Specialist

## 2018-09-22 ENCOUNTER — Other Ambulatory Visit: Payer: Self-pay

## 2018-09-22 DIAGNOSIS — Z01812 Encounter for preprocedural laboratory examination: Secondary | ICD-10-CM | POA: Insufficient documentation

## 2018-09-22 NOTE — Patient Instructions (Addendum)
  Your procedure is scheduled on: Monday September 27, 2018 Report to Same Day Surgery 2nd floor [facility] Entrance-take elevator on left to 2nd floor.  Check in with surgery information desk.) To find out your arrival time, call [phone] 1:00-3:00 PM on Friday September 24, 2018  Remember: Instructions that are not followed completely may result in serious medical risk, up to and including death, or upon the discretion of your surgeon and anesthesiologist your surgery may need to be rescheduled.    __x__ 1. Do not eat food (including mints, candies, chewing gum) after midnight the night before your procedure. You may drink clear liquids up to 2 hours before you are scheduled to arrive at the hospital for your procedure.  Do not drink anything within 2 hours of your scheduled arrival to the hospital.  Approved clear liquids:  --Water or Apple juice without pulp  --Clear carbohydrate beverage such as Gatorade or Powerade  --Black Coffee or Clear Tea (No milk, no creamers, do not add anything to the coffee or tea)    __x__ 2. No Alcohol for 24 hours before or after surgery.   __x__ 3. No Smoking or e-cigarettes for 24 hours before surgery.  Do not use any chewable tobacco products for at least 6 hours before surgery.   __x__ 4. Notify your doctor if there is any change in your medical condition (cold, fever, infections).   __x__ 5. On the morning of surgery brush your teeth with toothpaste and water.  You may rinse your mouth with mouthwash if you wish.  Do not swallow any toothpaste or mouthwash.  Please read over the following fact sheets that you were given:   [facility] Preparing for Surgery and/or MRSA Information    __x__ Use CHG Soap or Sage wipes as directed on instruction sheet    Do not wear jewelry, make-up, hairpins, clips or nail polish on the day of surgery.  Do not wear lotions, powders, deodorant, or perfumes.   Do not shave below the face/neck 48 hours  prior to surgery.   Do not bring valuables to the hospital.    [facility] is not responsible for any belongings or valuables.    For patients discharged on the day of surgery, you will NOT be permitted to drive yourself home.  You must have a responsible adult with you for 24 hours after surgery.  __x__ Take these medicines on the morning of surgery with a  SMALL SIP OF WATER:  1. Cetirizine/Zyrtec  2. Levothyroxine/Synthroid  3. Montelukast/Singulair  4. Magnesium  Skip your Losartan the morning of surgery.  __x__ Use inhalers (Albuterol and Advair) on the day of surgery and bring them with you to the hospital.  ____ Follow recommendations from Cardiologist, Pulmonologist or PCP regarding stopping Aspirin, Coumadin, Plavix, Eliquis, Effient, Pradaxa, and Pletal.  __x__ TODAY: Stop Anti-inflammatories such as Meloxicam, Advil, Ibuprofen, Motrin, Aleve, Naproxen, Naprosyn, BC/Goodies powders or aspirin products. You may continue to take Tylenol and Celebrex.   __x__ TODAY: Stop over the counter supplements-Fish Oil, Osteo Biflex

## 2018-09-26 MED ORDER — CLINDAMYCIN PHOSPHATE 600 MG/50ML IV SOLN: 600.0000 mg | INTRAVENOUS | Status: DC

## 2018-09-26 MED ORDER — CEFAZOLIN SODIUM-DEXTROSE 2-4 GM/100ML-% IV SOLN: 2.0000 g | INTRAVENOUS | Status: DC

## 2018-09-27 ENCOUNTER — Other Ambulatory Visit: Payer: Self-pay

## 2018-09-27 ENCOUNTER — Encounter
Admission: RE | Disposition: A | Discharge status: Home / Self Care | Payer: Self-pay | Source: Home / Self Care | Attending: Specialist

## 2018-09-27 ENCOUNTER — Ambulatory Visit
Admission: RE | Admit: 2018-09-27 | Discharge: 2018-09-27 | Disposition: A | Discharge status: Home / Self Care | Payer: BLUE CROSS/BLUE SHIELD | Attending: Specialist | Admitting: Specialist

## 2018-09-27 ENCOUNTER — Encounter: Payer: Self-pay | Admitting: *Deleted

## 2018-09-27 ENCOUNTER — Ambulatory Visit: Payer: BLUE CROSS/BLUE SHIELD | Admitting: Anesthesiology

## 2018-09-27 DIAGNOSIS — I10 Essential (primary) hypertension: Secondary | ICD-10-CM | POA: Insufficient documentation

## 2018-09-27 DIAGNOSIS — Z6832 Body mass index (BMI) 32.0-32.9, adult: Secondary | ICD-10-CM | POA: Insufficient documentation

## 2018-09-27 DIAGNOSIS — M7542 Impingement syndrome of left shoulder: Secondary | ICD-10-CM | POA: Insufficient documentation

## 2018-09-27 DIAGNOSIS — M75102 Unspecified rotator cuff tear or rupture of left shoulder, not specified as traumatic: Secondary | ICD-10-CM | POA: Insufficient documentation

## 2018-09-27 DIAGNOSIS — E669 Obesity, unspecified: Secondary | ICD-10-CM | POA: Insufficient documentation

## 2018-09-27 HISTORY — PX: SHOULDER ARTHROSCOPY WITH OPEN ROTATOR CUFF REPAIR: SHX6092

## 2018-09-27 SURGERY — ARTHROSCOPY, SHOULDER WITH REPAIR, ROTATOR CUFF, OPEN
Anesthesia: General | Site: Shoulder | Laterality: Left

## 2018-09-27 MED ORDER — CEFAZOLIN SODIUM-DEXTROSE 2-4 GM/100ML-% IV SOLN
INTRAVENOUS | Status: AC
  Filled 2018-09-27: qty 100

## 2018-09-27 MED ORDER — HYDROCODONE-ACETAMINOPHEN 5-325 MG PO TABS: 1.0000 | ORAL_TABLET | Freq: Four times a day (QID) | ORAL | 0 refills | Status: DC | PRN

## 2018-09-27 MED ORDER — EPINEPHRINE PF 1 MG/ML IJ SOLN
INTRAMUSCULAR | Status: DC | PRN
  Administered 2018-09-27: 6 mL

## 2018-09-27 MED ORDER — FENTANYL CITRATE (PF) 100 MCG/2ML IJ SOLN
INTRAMUSCULAR | Status: AC
  Filled 2018-09-27: qty 2

## 2018-09-27 MED ORDER — FENTANYL CITRATE (PF) 100 MCG/2ML IJ SOLN: 50.0000 ug | Freq: Once | INTRAMUSCULAR | Status: DC

## 2018-09-27 MED ORDER — FAMOTIDINE 20 MG PO TABS
20.0000 mg | ORAL_TABLET | Freq: Once | ORAL | Status: AC
  Administered 2018-09-27: 20 mg via ORAL

## 2018-09-27 MED ORDER — ROCURONIUM BROMIDE 50 MG/5ML IV SOLN
INTRAVENOUS | Status: AC
  Filled 2018-09-27: qty 1

## 2018-09-27 MED ORDER — ROCURONIUM BROMIDE 100 MG/10ML IV SOLN
INTRAVENOUS | Status: DC | PRN
  Administered 2018-09-27: 20 mg via INTRAVENOUS
  Administered 2018-09-27: 30 mg via INTRAVENOUS

## 2018-09-27 MED ORDER — BUPIVACAINE LIPOSOME 1.3 % IJ SUSP
INTRAMUSCULAR | Status: AC
  Filled 2018-09-27: qty 20

## 2018-09-27 MED ORDER — BUPIVACAINE-EPINEPHRINE (PF) 0.25% -1:200000 IJ SOLN
INTRAMUSCULAR | Status: DC | PRN
  Administered 2018-09-27 (×2): 30 mL

## 2018-09-27 MED ORDER — SUGAMMADEX SODIUM 200 MG/2ML IV SOLN
INTRAVENOUS | Status: DC | PRN
  Administered 2018-09-27: 200 mg via INTRAVENOUS

## 2018-09-27 MED ORDER — CLINDAMYCIN PHOSPHATE 600 MG/50ML IV SOLN
INTRAVENOUS | Status: DC | PRN
  Administered 2018-09-27: 600 mg via INTRAVENOUS

## 2018-09-27 MED ORDER — EPHEDRINE SULFATE 50 MG/ML IJ SOLN
INTRAMUSCULAR | Status: DC | PRN
  Administered 2018-09-27: 10 mg via INTRAVENOUS

## 2018-09-27 MED ORDER — GABAPENTIN 300 MG PO CAPS
300.0000 mg | ORAL_CAPSULE | ORAL | Status: AC
  Administered 2018-09-27: 300 mg via ORAL

## 2018-09-27 MED ORDER — BUPIVACAINE LIPOSOME 1.3 % IJ SUSP
INTRAMUSCULAR | Status: DC | PRN
  Administered 2018-09-27: 20 mL

## 2018-09-27 MED ORDER — BUPIVACAINE HCL (PF) 0.5 % IJ SOLN
INTRAMUSCULAR | Status: AC
  Filled 2018-09-27: qty 10

## 2018-09-27 MED ORDER — MELOXICAM 7.5 MG PO TABS
15.0000 mg | ORAL_TABLET | ORAL | Status: AC
  Administered 2018-09-27: 15 mg via ORAL

## 2018-09-27 MED ORDER — BUPIVACAINE-EPINEPHRINE (PF) 0.25% -1:200000 IJ SOLN
INTRAMUSCULAR | Status: AC
  Filled 2018-09-27: qty 30

## 2018-09-27 MED ORDER — EPINEPHRINE 30 MG/30ML IJ SOLN
INTRAMUSCULAR | Status: AC
  Filled 2018-09-27: qty 1

## 2018-09-27 MED ORDER — CEFAZOLIN SODIUM-DEXTROSE 2-3 GM-%(50ML) IV SOLR
INTRAVENOUS | Status: DC | PRN
  Administered 2018-09-27: 2 g via INTRAVENOUS

## 2018-09-27 MED ORDER — ONDANSETRON HCL 4 MG/2ML IJ SOLN
INTRAMUSCULAR | Status: DC | PRN
  Administered 2018-09-27: 4 mg via INTRAVENOUS

## 2018-09-27 MED ORDER — EPHEDRINE SULFATE 50 MG/ML IJ SOLN
INTRAMUSCULAR | Status: AC
  Filled 2018-09-27: qty 1

## 2018-09-27 MED ORDER — LACTATED RINGERS IV SOLN
INTRAVENOUS | Status: DC
  Administered 2018-09-27: 08:00:00 via INTRAVENOUS

## 2018-09-27 MED ORDER — PROPOFOL 10 MG/ML IV BOLUS
INTRAVENOUS | Status: DC | PRN
  Administered 2018-09-27: 50 mg via INTRAVENOUS
  Administered 2018-09-27: 150 mg via INTRAVENOUS

## 2018-09-27 MED ORDER — LIDOCAINE HCL (PF) 1 % IJ SOLN
INTRAMUSCULAR | Status: AC
  Filled 2018-09-27: qty 5

## 2018-09-27 MED ORDER — CLINDAMYCIN PHOSPHATE 600 MG/50ML IV SOLN
INTRAVENOUS | Status: AC
  Filled 2018-09-27: qty 50

## 2018-09-27 MED ORDER — FAMOTIDINE 20 MG PO TABS
ORAL_TABLET | ORAL | Status: AC
  Filled 2018-09-27: qty 1

## 2018-09-27 MED ORDER — BUPIVACAINE HCL (PF) 0.5 % IJ SOLN
INTRAMUSCULAR | Status: DC | PRN
  Administered 2018-09-27: 10 mL

## 2018-09-27 MED ORDER — ACETAMINOPHEN 10 MG/ML IV SOLN
INTRAVENOUS | Status: AC
  Filled 2018-09-27: qty 100

## 2018-09-27 MED ORDER — CHLORHEXIDINE GLUCONATE CLOTH 2 % EX PADS: 6.0000 | MEDICATED_PAD | Freq: Once | CUTANEOUS | Status: DC

## 2018-09-27 MED ORDER — PROPOFOL 10 MG/ML IV BOLUS
INTRAVENOUS | Status: AC
  Filled 2018-09-27: qty 20

## 2018-09-27 MED ORDER — GABAPENTIN 400 MG PO CAPS: 400.0000 mg | ORAL_CAPSULE | Freq: Two times a day (BID) | ORAL | 3 refills | Status: AC

## 2018-09-27 MED ORDER — ONDANSETRON HCL 4 MG/2ML IJ SOLN
INTRAMUSCULAR | Status: AC
  Filled 2018-09-27: qty 2

## 2018-09-27 MED ORDER — FENTANYL CITRATE (PF) 100 MCG/2ML IJ SOLN: 25.0000 ug | INTRAMUSCULAR | Status: DC | PRN

## 2018-09-27 MED ORDER — ACETAMINOPHEN 10 MG/ML IV SOLN
INTRAVENOUS | Status: DC | PRN
  Administered 2018-09-27: 1000 mg via INTRAVENOUS

## 2018-09-27 MED ORDER — LIDOCAINE HCL (CARDIAC) PF 100 MG/5ML IV SOSY
PREFILLED_SYRINGE | INTRAVENOUS | Status: DC | PRN
  Administered 2018-09-27: 60 mg via INTRAVENOUS

## 2018-09-27 MED ORDER — MIDAZOLAM HCL 2 MG/2ML IJ SOLN
INTRAMUSCULAR | Status: AC
  Filled 2018-09-27: qty 2

## 2018-09-27 MED ORDER — MIDAZOLAM HCL 2 MG/2ML IJ SOLN: 1.0000 mg | Freq: Once | INTRAMUSCULAR | Status: DC

## 2018-09-27 MED ORDER — ONDANSETRON HCL 4 MG/2ML IJ SOLN: 4.0000 mg | Freq: Once | INTRAMUSCULAR | Status: DC | PRN

## 2018-09-27 MED ORDER — FENTANYL CITRATE (PF) 100 MCG/2ML IJ SOLN
INTRAMUSCULAR | Status: DC | PRN
  Administered 2018-09-27: 100 ug via INTRAVENOUS

## 2018-09-27 MED ORDER — MELOXICAM 7.5 MG PO TABS
ORAL_TABLET | ORAL | Status: AC
  Administered 2018-09-27: 15 mg via ORAL
  Filled 2018-09-27: qty 2

## 2018-09-27 MED ORDER — LIDOCAINE HCL (PF) 2 % IJ SOLN
INTRAMUSCULAR | Status: AC
  Filled 2018-09-27: qty 10

## 2018-09-27 MED ORDER — SUGAMMADEX SODIUM 200 MG/2ML IV SOLN
INTRAVENOUS | Status: AC
  Filled 2018-09-27: qty 2

## 2018-09-27 MED ORDER — MELOXICAM 15 MG PO TABS: 15.0000 mg | ORAL_TABLET | Freq: Every day | ORAL | 3 refills | Status: DC

## 2018-09-27 MED ORDER — DEXAMETHASONE SODIUM PHOSPHATE 4 MG/ML IJ SOLN
INTRAMUSCULAR | Status: AC
  Filled 2018-09-27: qty 1

## 2018-09-27 MED ORDER — DEXAMETHASONE SODIUM PHOSPHATE 10 MG/ML IJ SOLN
INTRAMUSCULAR | Status: DC | PRN
  Administered 2018-09-27: 10 mg via INTRAVENOUS

## 2018-09-27 MED ORDER — GABAPENTIN 300 MG PO CAPS
ORAL_CAPSULE | ORAL | Status: AC
  Filled 2018-09-27: qty 1

## 2018-09-27 SURGICAL SUPPLY — 53 items
ADAPTER IRRIG TUBE 2 SPIKE SOL (ADAPTER) ×2 IMPLANT
BLADE AGGRESSIVE PLUS 4.0 (BLADE) ×2 IMPLANT
BUR AGGRESSIVE+ 5.5 (BURR) ×2 IMPLANT
BUR BR 5.5 12 FLUTE (BURR) IMPLANT
BUR RADIUS 4.0X18.5 (BURR) ×2 IMPLANT
BUR RADIUS 5.5 (BURR) IMPLANT
CANNULA 5.75X7 CRYSTAL CLEAR (CANNULA) IMPLANT
CANNULA 8.5X75 THRED (CANNULA) IMPLANT
CANNULA PARTIAL THREAD 2X7 (CANNULA) IMPLANT
CHLORAPREP W/TINT 26 (MISCELLANEOUS) ×2 IMPLANT
CONNECTOR PERFECT PASSER (CONNECTOR) ×1 IMPLANT
COVER MAYO STAND STRL (DRAPES) ×2 IMPLANT
COVER WAND RF STERILE (DRAPES) ×1 IMPLANT
DRAPE IMP U-DRAPE 54X76 (DRAPES) ×4 IMPLANT
DRAPE SHEET LG 3/4 BI-LAMINATE (DRAPES) IMPLANT
DRAPE STERI 35X30 U-POUCH (DRAPES) ×2 IMPLANT
GAUZE PETRO XEROFOAM 1X8 (MISCELLANEOUS) ×2 IMPLANT
GAUZE SPONGE 4X4 12PLY STRL (GAUZE/BANDAGES/DRESSINGS) ×4 IMPLANT
GLOVE SURG ORTHO 8.0 STRL STRW (GLOVE) ×2 IMPLANT
GOWN STRL REUS W/TWL LRG LVL4 (GOWN DISPOSABLE) ×3 IMPLANT
IV LACTATED RINGER IRRG 3000ML (IV SOLUTION) ×6
IV LR IRRIG 3000ML ARTHROMATIC (IV SOLUTION) ×4 IMPLANT
KIT SHOULDER TRACTION (DRAPES) ×2 IMPLANT
KIT TURNOVER KIT A (KITS) ×2 IMPLANT
MANIFOLD NEPTUNE II (INSTRUMENTS) ×2 IMPLANT
MAT ABSORB  FLUID 56X50 GRAY (MISCELLANEOUS) ×1
MAT ABSORB FLUID 56X50 GRAY (MISCELLANEOUS) ×1 IMPLANT
NDL SAFETY ECLIPSE 18X1.5 (NEEDLE) ×1 IMPLANT
NDL SPNL 18GX3.5 QUINCKE PK (NEEDLE) ×1 IMPLANT
NEEDLE HYPO 18GX1.5 SHARP (NEEDLE) ×2
NEEDLE SPNL 18GX3.5 QUINCKE PK (NEEDLE) ×2 IMPLANT
NS IRRIG 500ML POUR BTL (IV SOLUTION) ×1 IMPLANT
PACK ARTHROSCOPY SHOULDER (MISCELLANEOUS) ×2 IMPLANT
PASSER SUT CAPTURE FIRST (SUTURE) ×1 IMPLANT
SET TUBE SUCT SHAVER OUTFL 24K (TUBING) ×2 IMPLANT
SLING ARM LRG DEEP (SOFTGOODS) ×1 IMPLANT
SLING ULTRA II LG (MISCELLANEOUS) ×1 IMPLANT
SOL PREP PVP 2OZ (MISCELLANEOUS) ×2
SOLUTION PREP PVP 2OZ (MISCELLANEOUS) ×1 IMPLANT
SUT ETHILON 3 0 FSLX (SUTURE) ×2 IMPLANT
SUT PDS PLUS 0 (SUTURE)
SUT PDS PLUS AB 0 CT-2 (SUTURE) ×1 IMPLANT
SUT PERFECTPASSER WHITE CART (SUTURE) ×1 IMPLANT
SUT VIC AB 2-0 CT2 27 (SUTURE) ×1 IMPLANT
SUT VICRYL 3-0 27IN (SUTURE) ×1 IMPLANT
SUTURE MAGNUM WIRE 2X48 BLK (SUTURE) ×1 IMPLANT
SYR 20CC LL (SYRINGE) ×2 IMPLANT
SYR 30ML LL (SYRINGE) ×3 IMPLANT
SYR 50ML LL SCALE MARK (SYRINGE) ×2 IMPLANT
TUBING ARTHRO INFLOW-ONLY STRL (TUBING) ×2 IMPLANT
TUBING CONNECTING 10 (TUBING) ×2 IMPLANT
WAND COBLATION FLOW 50 (SURGICAL WAND) IMPLANT
WAND WEREWOLF FLOW 90D (MISCELLANEOUS) ×2 IMPLANT

## 2018-09-27 NOTE — H&P (Signed)
THE PATIENT WAS SEEN PRIOR TO SURGERY TODAY.  HISTORY, ALLERGIES, HOME MEDICATIONS AND OPERATIVE PROCEDURE WERE REVIEWED. RISKS AND BENEFITS OF SURGERY DISCUSSED WITH PATIENT AGAIN.  NO CHANGES FROM INITIAL HISTORY AND PHYSICAL NOTED.    

## 2018-09-27 NOTE — Anesthesia Procedure Notes (Signed)
Anesthesia Regional Block: Interscalene brachial plexus block   Pre-Anesthetic Checklist: ,, timeout performed, Correct Patient, Correct Site, Correct Laterality, Correct Procedure, Correct Position, site marked, Risks and benefits discussed,  Surgical consent,  Pre-op evaluation,  At surgeon's request and post-op pain management  Laterality: Left  Prep: chloraprep, alcohol swabs       Needles:  Injection technique: Single-shot  Needle Type: Stimiplex     Needle Length: 5cm  Needle Gauge: 21     Additional Needles:   Procedures:, nerve stimulator,,, ultrasound used (permanent image in chart),,,,   Nerve Stimulator or Paresthesia:  Response: biceps flexion, 0.6 mA,   Additional Responses:   Narrative:  Start time: 09/27/2018 8:40 AM End time: 09/27/2018 8:50 AM Injection made incrementally with aspirations every 5 mL.  Performed by: Personally  Anesthesiologist: Alvin Critchley, MD  Additional Notes: Time out called.  A roll was placed under the left shoulder.  A scout film was made and the target area was marked with a line drawn lateral to the US probe at the levekl of C6.  The area was prepped with alcohol and a skin wheal was made along the line with 1% Lidocaine plain.  The area was prepped and draped in sterile fashion.  The US probe was again used to locate the nerve bundle in a somewhat vertical fashion.  The stimuplex needle was guided into position with the return of a biceps twitch with fade at 0.6 mAmps.  Incremental and easy injection of a mix of Exparel and 0.5% Marcaine plain was given after negative aspirations.  The patient tolerated the procedure well, and no pain on injection.

## 2018-09-27 NOTE — Discharge Instructions (Signed)

## 2018-09-27 NOTE — Anesthesia Procedure Notes (Addendum)
Procedure Name: Intubation Date/Time: 09/27/2018 9:27 AM Performed by: Jonna Clark, CRNA Pre-anesthesia Checklist: Patient identified, Patient being monitored, Timeout performed, Emergency Drugs available and Suction available Patient Re-evaluated:Patient Re-evaluated prior to induction Oxygen Delivery Method: Circle system utilized Preoxygenation: Pre-oxygenation with 100% oxygen Induction Type: IV induction Ventilation: Mask ventilation without difficulty Laryngoscope Size: 3 and McGraph Grade View: Grade II Tube type: Oral Tube size: 7.0 mm Number of attempts: 1 Airway Equipment and Method: Stylet Placement Confirmation: ETT inserted through vocal cords under direct vision,  positive ETCO2 and breath sounds checked- equal and bilateral Secured at: 21 cm Tube secured with: Tape Dental Injury: Teeth and Oropharynx as per pre-operative assessment  Difficulty Due To: Difficulty was anticipated and Difficult Airway- due to anterior larynx

## 2018-09-27 NOTE — Anesthesia Post-op Follow-up Note (Signed)
Anesthesia QCDR form completed.        

## 2018-09-27 NOTE — Op Note (Signed)
09/27/2018  11:14 AM  PATIENT:  Catherine Mcclain    PRE-OPERATIVE DIAGNOSIS:  IMPINGEMENT SYNDROME OF LEFT SHOULDER REGION  POST-OPERATIVE DIAGNOSIS:  Same  PROCEDURE:  SHOULDER ARTHROSCOPY WITH 1) DISTAL CLAVICLE RESECTION, 2) SUBACROMIAL DECOMPRESSION, 3) BICEPS TENOTOMY, AND 4) DEBRIDEMENT, LEFT  SURGEON:  Park Breed, MD  ANESTHESIA:   General  PREOPERATIVE INDICATIONS:  Catherine Mcclain is a  63 y.o. female with a diagnosis of IMPINGEMENT SYNDROME OF LEFT SHOULDER REGION who failed conservative measures and elected for surgical management.    The risks benefits and alternatives were discussed with the patient preoperatively including but not limited to the risks of infection, bleeding, nerve injury, cardiopulmonary complications, the need for revision surgery, among others, and the patient was willing to proceed.  EBL: Minimal   OPERATIVE FINDINGS: Partial tear articular surface of the supraspinatus proximally 40%.  Fraying of the biceps tendon as it entered the shoulder joint.  Significant bursitis in the subacromial space.  Superficial fraying of the bursal side of the rotator cuff.  Narrowing and spurring of the [facility] joint.  Prominence of the anterior bony acromion.  OPERATIVE PROCEDURE: The patient was brought to the operating room where satisfactory general endotracheal and interscalene anesthesia were accomplished.The patient was turned into the lateral decubitus position and the shoulder was prepped and draped in a sterile fashion. Arthroscopy was carried out from a posterior portal with accessory portals laterally and anteriorly. The  joint was examined first. The above findings were encountered. The motorized shaver was introduced anteriorly and the undersurface of the rotator cuff probed and lightly debrided. The biceps tendon was tenotomized with the ArthroCare wand.  The labrum was trimmed up with the ArthroCare wand. The arthroscope was redirected into the subacromial  space. There was severe bursitis which was resected with the motorized shaver and ArthroCare wand. The large bur was introduced from a posterior portal and the anterior acromion was debrided. The undersurface of the clavicle was debrided with the bur which was then reintroduced from an anterior portal and the remaining distal clavicle completely excised.   Final debridement was carried out with the motorized shaver and the ArthroCare wand. The joint was flushed and the stab wounds and closed with 3-0 nylon suture. 1/4% Marcaine with morphine was injected into the shoulder. Sponge and needle counts were correct.   The dry sterile dressing and was applied along with a  sling. Patient was awakened and taken recovery in good condition.  Park Breed, MD

## 2018-09-27 NOTE — Transfer of Care (Signed)
Immediate Anesthesia Transfer of Care Note  Patient: Catherine Mcclain  Procedure(s) Performed: SHOULDER ARTHROSCOPY WITH DEBRIDEMENT, LEFT (Left Shoulder)  Patient Location: PACU  Anesthesia Type:General  Level of Consciousness: drowsy and patient cooperative  Airway & Oxygen Therapy: Patient Spontanous Breathing and Patient connected to face mask oxygen  Post-op Assessment: Report given to RN and Post -op Vital signs reviewed and stable  Post vital signs: Reviewed and stable  Last Vitals:  Vitals Value Taken Time  BP 134/66 09/27/2018 11:17 AM  Temp 36.7 C 09/27/2018 11:15 AM  Pulse 96 09/27/2018 11:18 AM  Resp 17 09/27/2018 11:18 AM  SpO2 100 % 09/27/2018 11:18 AM  Vitals shown include unvalidated device data.  Last Pain:  Vitals:   09/27/18 0752  TempSrc: Tympanic  PainSc: 0-No pain         Complications: No apparent anesthesia complications

## 2018-09-27 NOTE — Anesthesia Preprocedure Evaluation (Addendum)
Anesthesia Evaluation  Patient identified by MRN, date of birth, ID band Patient awake    Reviewed: Allergy & Precautions, H&P , NPO status , Patient's Chart, lab work & pertinent test results  History of Anesthesia Complications Negative for: history of anesthetic complications  Airway Mallampati: III  TM Distance: <3 FB Neck ROM: limited    Dental  (+) Chipped   Pulmonary neg pulmonary ROS, neg shortness of breath, asthma , Recent URI ,           Cardiovascular Exercise Tolerance: Good hypertension, (-) angina(-) Past MI and (-) DOE      Neuro/Psych negative neurological ROS  negative psych ROS   GI/Hepatic negative GI ROS, Neg liver ROS, neg GERD  ,  Endo/Other  Hypothyroidism   Renal/GU      Musculoskeletal  (+) Arthritis ,   Abdominal   Peds  Hematology negative hematology ROS (+) anemia ,   Anesthesia Other Findings Past Medical History: No date: Anemia     Comment:  AS A TEENAGER No date: Arthritis No date: Asthma     Comment:  WELL CONTROLLED No date: Hyperlipidemia No date: Hypertension No date: Hypothyroidism No date: Obesity No date: Osteopenia  Past Surgical History: 10/2010: BREAST BIOPSY; Right     Comment:  NEG 05/03/2018: COLONOSCOPY WITH PROPOFOL; N/A     Comment:  Procedure: COLONOSCOPY WITH PROPOFOL;  Surgeon:               Lollie Sails, MD;  Location: [facility] ENDOSCOPY;                Service: Endoscopy;  Laterality: N/A; No date: FUNCTIONAL ENDOSCOPIC SINUS SURGERY No date: THYROID LOBECTOMY  BMI    Body Mass Index:  32.26 kg/m      Reproductive/Obstetrics negative OB ROS                             Anesthesia Physical  Anesthesia Plan  ASA: III  Anesthesia Plan: General ETT   Post-op Pain Management:  Regional for Post-op pain   Induction: Intravenous  PONV Risk Score and Plan: Ondansetron, Dexamethasone, Midazolam and Treatment may  vary due to age or medical condition  Airway Management Planned: Oral ETT  Additional Equipment:   Intra-op Plan:   Post-operative Plan: Extubation in OR  Informed Consent: I have reviewed the patients History and Physical, chart, labs and discussed the procedure including the risks, benefits and alternatives for the proposed anesthesia with the patient or authorized representative who has indicated his/her understanding and acceptance.     Dental Advisory Given  Plan Discussed with: Anesthesiologist, CRNA and Surgeon  Anesthesia Plan Comments: (Patient consented for risks of anesthesia including but not limited to:  - adverse reactions to medications - damage to teeth, lips or other oral mucosa - sore throat or hoarseness - Damage to heart, brain, lungs or loss of life  Patient voiced understanding.  Patient accepts risks of interscalene nerve block which includes nerve damage, pneumothorax, infection and block not working well and wishes to proceed with the block that was requested by Dr. Sabra Heck.)       Anesthesia Quick Evaluation

## 2018-09-29 NOTE — Anesthesia Postprocedure Evaluation (Signed)
Anesthesia Post Note  Patient: Catherine Mcclain  Procedure(s) Performed: SHOULDER ARTHROSCOPY WITH DEBRIDEMENT, LEFT (Left Shoulder)  Patient location during evaluation: PACU Anesthesia Type: General Level of consciousness: awake and alert and oriented Pain management: pain level controlled Vital Signs Assessment: post-procedure vital signs reviewed and stable Respiratory status: spontaneous breathing Cardiovascular status: blood pressure returned to baseline Anesthetic complications: no     Last Vitals:  Vitals:   09/27/18 1145 09/27/18 1226  BP: (!) 147/70 125/62  Pulse: 82 85  Resp: (!) 21 16  Temp: 36.4 C 36.7 C  SpO2: 100% 98%    Last Pain:  Vitals:   09/28/18 0835  TempSrc:   PainSc: 1                  Torina Ey

## 2018-12-22 ENCOUNTER — Other Ambulatory Visit: Payer: Self-pay | Admitting: Nurse Practitioner

## 2018-12-22 DIAGNOSIS — Z1231 Encounter for screening mammogram for malignant neoplasm of breast: Secondary | ICD-10-CM

## 2019-01-20 ENCOUNTER — Ambulatory Visit
Admission: RE | Admit: 2019-01-20 | Discharge: 2019-01-20 | Disposition: A | Discharge status: Home / Self Care | Payer: BC Managed Care – PPO | Source: Ambulatory Visit | Attending: Nurse Practitioner | Admitting: Nurse Practitioner

## 2019-01-20 DIAGNOSIS — Z1231 Encounter for screening mammogram for malignant neoplasm of breast: Secondary | ICD-10-CM | POA: Diagnosis not present

## 2019-06-27 DIAGNOSIS — R7302 Impaired glucose tolerance (oral): Secondary | ICD-10-CM | POA: Insufficient documentation

## 2019-09-26 ENCOUNTER — Ambulatory Visit: Payer: BC Managed Care – PPO | Attending: Internal Medicine

## 2019-09-26 DIAGNOSIS — Z23 Encounter for immunization: Secondary | ICD-10-CM

## 2019-09-26 NOTE — Progress Notes (Signed)
   Covid-19 Vaccination Clinic  Name:  Catherine Mcclain    MRN: [id number] DOB: 03/28/1956  09/26/2019  Catherine Mcclain was observed post Covid-19 immunization for 15 minutes without incident. She was provided with Vaccine Information Sheet and instruction to access the V-Safe system.   Catherine Mcclain was instructed to call 911 with any severe reactions post vaccine: Marland Kitchen Difficulty breathing  . Swelling of face and throat  . A fast heartbeat  . A bad rash all over body  . Dizziness and weakness   Immunizations Administered    Name Date Dose VIS Date Route   Moderna COVID-19 Vaccine 09/26/2019  1:53 PM 0.5 mL 06/14/2019 Intramuscular   Manufacturer: Moderna   Lot: [id number]   [id number]

## 2020-01-02 ENCOUNTER — Other Ambulatory Visit: Payer: Self-pay | Admitting: Nurse Practitioner

## 2020-01-02 DIAGNOSIS — Z1231 Encounter for screening mammogram for malignant neoplasm of breast: Secondary | ICD-10-CM

## 2020-01-24 ENCOUNTER — Ambulatory Visit
Admission: RE | Admit: 2020-01-24 | Discharge: 2020-01-24 | Disposition: A | Discharge status: Home / Self Care | Payer: BC Managed Care – PPO | Source: Ambulatory Visit | Attending: Nurse Practitioner | Admitting: Nurse Practitioner

## 2020-01-24 DIAGNOSIS — Z1231 Encounter for screening mammogram for malignant neoplasm of breast: Secondary | ICD-10-CM | POA: Insufficient documentation

## 2020-12-19 ENCOUNTER — Other Ambulatory Visit: Payer: Self-pay | Admitting: Nurse Practitioner

## 2020-12-19 DIAGNOSIS — Z1231 Encounter for screening mammogram for malignant neoplasm of breast: Secondary | ICD-10-CM

## 2021-01-24 ENCOUNTER — Ambulatory Visit
Admission: RE | Admit: 2021-01-24 | Discharge: 2021-01-24 | Disposition: A | Discharge status: Home / Self Care | Payer: Medicare Other | Source: Ambulatory Visit | Attending: Nurse Practitioner | Admitting: Nurse Practitioner

## 2021-01-24 ENCOUNTER — Other Ambulatory Visit: Payer: Self-pay

## 2021-01-24 DIAGNOSIS — Z1231 Encounter for screening mammogram for malignant neoplasm of breast: Secondary | ICD-10-CM | POA: Diagnosis not present

## 2022-01-22 ENCOUNTER — Other Ambulatory Visit: Payer: Self-pay | Admitting: Nurse Practitioner

## 2022-01-22 DIAGNOSIS — Z1231 Encounter for screening mammogram for malignant neoplasm of breast: Secondary | ICD-10-CM

## 2022-02-13 ENCOUNTER — Ambulatory Visit
Admission: RE | Admit: 2022-02-13 | Discharge: 2022-02-13 | Disposition: A | Discharge status: Home / Self Care | Payer: Medicare Other | Source: Ambulatory Visit | Attending: Nurse Practitioner | Admitting: Nurse Practitioner

## 2022-02-13 DIAGNOSIS — Z1231 Encounter for screening mammogram for malignant neoplasm of breast: Secondary | ICD-10-CM | POA: Insufficient documentation

## 2022-02-19 ENCOUNTER — Other Ambulatory Visit: Payer: Self-pay | Admitting: Nurse Practitioner

## 2022-02-19 DIAGNOSIS — R928 Other abnormal and inconclusive findings on diagnostic imaging of breast: Secondary | ICD-10-CM

## 2022-02-19 DIAGNOSIS — R921 Mammographic calcification found on diagnostic imaging of breast: Secondary | ICD-10-CM

## 2022-03-04 ENCOUNTER — Ambulatory Visit
Admission: RE | Admit: 2022-03-04 | Discharge: 2022-03-04 | Disposition: A | Discharge status: Home / Self Care | Payer: Medicare Other | Source: Ambulatory Visit | Attending: Nurse Practitioner | Admitting: Nurse Practitioner

## 2022-03-04 DIAGNOSIS — R921 Mammographic calcification found on diagnostic imaging of breast: Secondary | ICD-10-CM | POA: Diagnosis present

## 2022-03-04 DIAGNOSIS — R928 Other abnormal and inconclusive findings on diagnostic imaging of breast: Secondary | ICD-10-CM | POA: Diagnosis not present

## 2022-03-07 ENCOUNTER — Other Ambulatory Visit: Payer: Self-pay | Admitting: Nurse Practitioner

## 2022-03-07 DIAGNOSIS — R928 Other abnormal and inconclusive findings on diagnostic imaging of breast: Secondary | ICD-10-CM

## 2022-03-07 DIAGNOSIS — R921 Mammographic calcification found on diagnostic imaging of breast: Secondary | ICD-10-CM

## 2022-03-21 ENCOUNTER — Ambulatory Visit
Admission: RE | Admit: 2022-03-21 | Discharge: 2022-03-21 | Disposition: A | Discharge status: Home / Self Care | Payer: Medicare Other | Source: Ambulatory Visit | Attending: Nurse Practitioner | Admitting: Nurse Practitioner

## 2022-03-21 DIAGNOSIS — R921 Mammographic calcification found on diagnostic imaging of breast: Secondary | ICD-10-CM

## 2022-03-21 DIAGNOSIS — R928 Other abnormal and inconclusive findings on diagnostic imaging of breast: Secondary | ICD-10-CM | POA: Diagnosis present

## 2022-03-21 HISTORY — PX: BREAST BIOPSY: SHX20

## 2022-03-24 LAB — SURGICAL PATHOLOGY

## 2022-04-09 ENCOUNTER — Ambulatory Visit (INDEPENDENT_AMBULATORY_CARE_PROVIDER_SITE_OTHER): Payer: Medicare Other

## 2022-04-09 ENCOUNTER — Ambulatory Visit: Payer: Medicare Other | Admitting: Podiatry

## 2022-04-09 ENCOUNTER — Encounter: Payer: Self-pay | Admitting: Podiatry

## 2022-04-09 DIAGNOSIS — E559 Vitamin D deficiency, unspecified: Secondary | ICD-10-CM | POA: Insufficient documentation

## 2022-04-09 DIAGNOSIS — M1990 Unspecified osteoarthritis, unspecified site: Secondary | ICD-10-CM | POA: Insufficient documentation

## 2022-04-09 DIAGNOSIS — M722 Plantar fascial fibromatosis: Secondary | ICD-10-CM

## 2022-04-09 DIAGNOSIS — E890 Postprocedural hypothyroidism: Secondary | ICD-10-CM | POA: Insufficient documentation

## 2022-04-09 DIAGNOSIS — M4307 Spondylolysis, lumbosacral region: Secondary | ICD-10-CM | POA: Insufficient documentation

## 2022-04-09 DIAGNOSIS — J45909 Unspecified asthma, uncomplicated: Secondary | ICD-10-CM | POA: Insufficient documentation

## 2022-04-09 DIAGNOSIS — E669 Obesity, unspecified: Secondary | ICD-10-CM | POA: Insufficient documentation

## 2022-04-09 MED ORDER — MELOXICAM 15 MG PO TABS: 15.0000 mg | ORAL_TABLET | Freq: Every day | ORAL | 3 refills | Status: AC

## 2022-04-09 MED ORDER — TRIAMCINOLONE ACETONIDE 40 MG/ML IJ SUSP
20.0000 mg | Freq: Once | INTRAMUSCULAR | Status: AC
  Administered 2022-04-09: 20 mg

## 2022-04-09 MED ORDER — METHYLPREDNISOLONE 4 MG PO TBPK: ORAL_TABLET | ORAL | 0 refills | Status: DC

## 2022-04-09 NOTE — Progress Notes (Signed)
Subjective:  Patient ID: Catherine Mcclain, female    DOB: 08-18-1955,  MRN: [id number] HPI Chief Complaint  Patient presents with   Foot Pain    Plantar heel left - aching x 2-3 weeks, AM pain, noticed after working in the yard, history of PF, tried new shoes, icing at night   New Patient (Initial Visit)    66 y.o. female presents with the above complaint.   ROS: Denies fever chills nausea vomiting muscle aches pains calf pain back pain chest pain shortness of breath.  Past Medical History:  Diagnosis Date   Anemia    AS A TEENAGER   Arthritis    Asthma    WELL CONTROLLED   Hyperlipidemia    Hypertension    Hypothyroidism    Obesity    Osteopenia    Past Surgical History:  Procedure Laterality Date   BREAST BIOPSY Right 10/2010   NEG   BREAST BIOPSY Left 03/21/2022   Stereo Bx, X clip, path pending   BREAST BIOPSY Left 03/21/2022   Stereo bx, coil clip, path pending   COLONOSCOPY WITH PROPOFOL N/A 05/03/2018   Procedure: COLONOSCOPY WITH PROPOFOL;  Surgeon: Lollie Sails, MD;  Location: [facility] ENDOSCOPY;  Service: Endoscopy;  Laterality: N/A;   FUNCTIONAL ENDOSCOPIC SINUS SURGERY     SHOULDER ARTHROSCOPY WITH OPEN ROTATOR CUFF REPAIR Left 09/27/2018   Procedure: SHOULDER ARTHROSCOPY WITH DEBRIDEMENT, LEFT;  Surgeon: Earnestine Leys, MD;  Location: [facility] ORS;  Service: Orthopedics;  Laterality: Left;   SHOULDER ARTHROSCOPY WITH ROTATOR CUFF REPAIR Right 08/02/2018   Procedure: SHOULDER ARTHROSCOPY , subacromial deompression, distal clavicle excision, biceps release;  Surgeon: Earnestine Leys, MD;  Location: [facility] ORS;  Service: Orthopedics;  Laterality: Right;   THYROID LOBECTOMY      Current Outpatient Medications:    meloxicam (MOBIC) 15 MG tablet, Take 1 tablet (15 mg total) by mouth daily., Disp: 30 tablet, Rfl: 3   methylPREDNISolone (MEDROL DOSEPAK) 4 MG TBPK tablet, 6 day dose pack - take as directed, Disp: 21 tablet, Rfl: 0   albuterol (PROAIR HFA) 108 (90 Base)  MCG/ACT inhaler, Inhale 2 puffs into the lungs every 4 (four) hours as needed for shortness of breath., Disp: , Rfl:    Ascorbic Acid (VITAMIN C) 1000 MG tablet, Take 1,000 mg by mouth daily. , Disp: , Rfl:    cetirizine (ZYRTEC) 10 MG tablet, Take 10 mg by mouth every morning. , Disp: , Rfl:    cholecalciferol (VITAMIN D3) 25 MCG (1000 UT) tablet, Take 1,000 Units by mouth daily., Disp: , Rfl:    Fluticasone-Salmeterol (ADVAIR) 250-50 MCG/DOSE AEPB, Inhale 1 puff into the lungs 2 (two) times daily. , Disp: , Rfl:    gabapentin (NEURONTIN) 400 MG capsule, Take 1 capsule (400 mg total) by mouth 2 (two) times daily., Disp: 60 capsule, Rfl: 3   Glucosamine-Chondroitin (OSTEO BI-FLEX REGULAR STRENGTH PO), Take 2 tablets by mouth daily., Disp: , Rfl:    levothyroxine (SYNTHROID) 150 MCG tablet, Take 150 mcg by mouth every morning., Disp: , Rfl:    magnesium oxide (MAG-OX) 400 MG tablet, Take 400 mg by mouth daily., Disp: , Rfl:    montelukast (SINGULAIR) 10 MG tablet, 1 TABLET, ORAL, DAILY (Patient taking differently: Take 10 mg by mouth every morning. ), Disp: 90 tablet, Rfl: 1   Multiple Vitamin (MULTI-VITAMINS) TABS, Take 2 tablets by mouth daily., Disp: , Rfl:    olmesartan (BENICAR) 20 MG tablet, Take 20 mg by mouth daily., Disp: , Rfl:  Omega-3 Fatty Acids (FISH OIL) 1000 MG CAPS, Take 1,000 mg by mouth daily., Disp: , Rfl:    OVER THE COUNTER MEDICATION, Take 2 tablets by mouth daily. Vitafusion Calcium, Disp: , Rfl:    rosuvastatin (CRESTOR) 5 MG tablet, Take 5 mg by mouth daily., Disp: , Rfl:    vitamin B-12 (CYANOCOBALAMIN) 500 MCG tablet, Take 1,000 mcg by mouth daily., Disp: , Rfl:   Allergies  Allergen Reactions   Pollen Extract     Other reaction(s): Not available, Other (See Comments) Sneezing, itching, cough    Short Ragweed Pollen Ext     Other reaction(s): Not available   Review of Systems Objective:  There were no vitals filed for this visit.  General: Well developed,  nourished, in no acute distress, alert and oriented x3   Dermatological: Skin is warm, dry and supple bilateral. Nails x 10 are well maintained; remaining integument appears unremarkable at this time. There are no open sores, no preulcerative lesions, no rash or signs of infection present.  Vascular: Dorsalis Pedis artery and Posterior Tibial artery pedal pulses are 2/4 bilateral with immedate capillary fill time. Pedal hair growth present. No varicosities and no lower extremity edema present bilateral.   Neruologic: Grossly intact via light touch bilateral. Vibratory intact via tuning fork bilateral. Protective threshold with Semmes Wienstein monofilament intact to all pedal sites bilateral. Patellar and Achilles deep tendon reflexes 2+ bilateral. No Babinski or clonus noted bilateral.   Musculoskeletal: No gross boney pedal deformities bilateral. No pain, crepitus, or limitation noted with foot and ankle range of motion bilateral. Muscular strength 5/5 in all groups tested bilateral.  Pain on palpation medial calcaneal tubercle and to some degree the plantar central calcaneal tubercle of the left heel.  Gait: Unassisted, Nonantalgic.    Radiographs:  Radiographs taken today demonstrate an osseously mature foot soft tissue increase in density plantar fascial Caney insertion site of the area in question.  No acute findings identified.  Assessment & Plan:   Assessment: Planter fasciitis left  Plan: Continue use of her good tennis shoes with her orthotics on a regular basis.  I recommend she wear those even in the house.  We discussed etiology pathology conservative versus surgical therapies.  We discussed appropriate shoe gear stretching exercise ice therapy and shoe gear modifications.  I injected her left heel today with 20 mg Kenalog 5 mg Marcaine to the point of maximal tenderness.  Placed her in a plantar fascial brace and ordered methylprednisolone to be followed by meloxicam.  I will  follow-up with her in 6 weeks if necessary.     Kaylei Frink T. [place], [place]

## 2022-05-21 ENCOUNTER — Encounter: Payer: Self-pay | Admitting: Podiatry

## 2022-05-21 ENCOUNTER — Ambulatory Visit: Payer: Medicare Other | Admitting: Podiatry

## 2022-05-21 DIAGNOSIS — M722 Plantar fascial fibromatosis: Secondary | ICD-10-CM | POA: Diagnosis not present

## 2022-05-21 MED ORDER — TRIAMCINOLONE ACETONIDE 40 MG/ML IJ SUSP
20.0000 mg | Freq: Once | INTRAMUSCULAR | Status: AC
  Administered 2022-05-21: 20 mg

## 2022-05-21 NOTE — Progress Notes (Signed)
She presents today for follow-up of her Planter fasciitis to her left foot.  States that she completed the methylprednisolone without any issues.  She states that her foot is only about 50% improved and when she is really on it it pretty much goes back down to 0% improvement.  Objective: Vital signs are stable she alert oriented x3 she is still experiencing pain on palpation medial calcaneal tubercle and pulses are remaining palpable.  The area is warm to the touch but not as firm as it was previously.  Assessment: Slowly resolving Planter fasciitis 50% improvement.  Plan: Reinjected the area today 20 mg Kenalog 5 mg Marcaine to the point of maximal tenderness.  Tolerated procedure well without complication.  I will follow-up with her in about 6 weeks.  If not improved I think an MRI will be necessary.

## 2022-07-02 ENCOUNTER — Encounter: Payer: Self-pay | Admitting: Podiatry

## 2022-07-02 ENCOUNTER — Ambulatory Visit: Payer: Medicare Other | Admitting: Podiatry

## 2022-07-02 DIAGNOSIS — M722 Plantar fascial fibromatosis: Secondary | ICD-10-CM

## 2022-07-02 NOTE — Progress Notes (Signed)
She presents today for follow-up of her Planter fasciitis left she states that she is better than 90+ percent takes meloxicam on a regular basis.  Continues to wear her good shoe gear.  Objective: Vital signs are stable she alert and oriented x 3.  Pulses are palpable.  There is no erythema edema salines drainage or odor she has no pain on palpation medial calcaneal tubercle.  Assessment: Resolving Planter fasciitis currently.  Plan: Follow-up with me on as-needed basis continue all other conservative therapies.

## 2022-08-20 ENCOUNTER — Ambulatory Visit (INDEPENDENT_AMBULATORY_CARE_PROVIDER_SITE_OTHER): Payer: Medicare Other | Admitting: Podiatry

## 2022-08-20 ENCOUNTER — Encounter: Payer: Self-pay | Admitting: Podiatry

## 2022-08-20 DIAGNOSIS — M722 Plantar fascial fibromatosis: Secondary | ICD-10-CM | POA: Diagnosis not present

## 2022-08-20 DIAGNOSIS — G5782 Other specified mononeuropathies of left lower limb: Secondary | ICD-10-CM | POA: Diagnosis not present

## 2022-08-20 DIAGNOSIS — G5762 Lesion of plantar nerve, left lower limb: Secondary | ICD-10-CM

## 2022-08-20 MED ORDER — TRIAMCINOLONE ACETONIDE 40 MG/ML IJ SUSP
40.0000 mg | Freq: Once | INTRAMUSCULAR | Status: AC
  Administered 2022-08-20: 40 mg

## 2022-08-20 NOTE — Progress Notes (Signed)
She presents today with pain to the forefoot left.  Objective: Vital signs stable alert oriented x 3 palpable neuroma third interdigital space of the left foot.  Assessment neuroma third interspace left foot.  Plan: I injected the neuroma today 10 mg Kenalog 5 mg Marcaine point maximal tenderness.  Tolerated procedure well.

## 2022-10-01 ENCOUNTER — Ambulatory Visit: Payer: Medicare Other | Admitting: Podiatry

## 2022-10-01 ENCOUNTER — Ambulatory Visit (INDEPENDENT_AMBULATORY_CARE_PROVIDER_SITE_OTHER): Payer: Medicare Other

## 2022-10-01 DIAGNOSIS — S92504A Nondisplaced unspecified fracture of right lesser toe(s), initial encounter for closed fracture: Secondary | ICD-10-CM

## 2022-10-01 DIAGNOSIS — M722 Plantar fascial fibromatosis: Secondary | ICD-10-CM | POA: Diagnosis not present

## 2022-10-01 DIAGNOSIS — S90121A Contusion of right lesser toe(s) without damage to nail, initial encounter: Secondary | ICD-10-CM | POA: Diagnosis not present

## 2022-10-01 MED ORDER — TRIAMCINOLONE ACETONIDE 40 MG/ML IJ SUSP
40.0000 mg | Freq: Once | INTRAMUSCULAR | Status: AC
  Administered 2022-10-01: 40 mg

## 2022-10-01 NOTE — Progress Notes (Signed)
She presents today after injuring her fifth digit of her right foot.  She is also complaining of pain to the bilateral heels.  Objective: Vital signs are stable she is alert and oriented x 3.  Pulses are palpable.  Left foot demonstrates significant pain on palpation medial calcaneal tubercle of the left heel.  Similar findings on the right as well.  Fifth digit is edematous erythematous and ecchymotic.  Radiographs taken today demonstrate a minimally displaced fracture proximal diaphysis of the fifth digit proximal phalanx right foot.  Assessment: Fracture fifth digit right.  Plan fasciitis bilateral.  Plan: Injected the bilateral heels today 20 mg Kenalog 5 mg Marcaine point maximal tenderness instructed her on how to dress the toe and dispensed a Darco shoe.  Follow-up with me in 4 to 6 weeks

## 2022-10-02 ENCOUNTER — Encounter: Payer: Self-pay | Admitting: Podiatry

## 2022-11-12 ENCOUNTER — Ambulatory Visit: Payer: Medicare Other | Admitting: Podiatry

## 2022-11-17 ENCOUNTER — Ambulatory Visit (INDEPENDENT_AMBULATORY_CARE_PROVIDER_SITE_OTHER): Payer: Medicare Other

## 2022-11-17 ENCOUNTER — Encounter: Payer: Self-pay | Admitting: Podiatry

## 2022-11-17 ENCOUNTER — Ambulatory Visit: Payer: Medicare Other | Admitting: Podiatry

## 2022-11-17 DIAGNOSIS — M722 Plantar fascial fibromatosis: Secondary | ICD-10-CM

## 2022-11-17 DIAGNOSIS — S92504A Nondisplaced unspecified fracture of right lesser toe(s), initial encounter for closed fracture: Secondary | ICD-10-CM | POA: Diagnosis not present

## 2022-11-17 DIAGNOSIS — S92504D Nondisplaced unspecified fracture of right lesser toe(s), subsequent encounter for fracture with routine healing: Secondary | ICD-10-CM

## 2022-11-17 NOTE — Progress Notes (Signed)
She presents today for follow-up fracture of fifth toe right foot.  States that is still little tender and swollen but the heels are doing much better.  Objective: Vital signs are stable alert oriented x 3.  Pulses are palpable.  There is no erythema edema salines drainage or odor still has tenderness and swelling of the fifth digit of the right foot.  Radiographs taken today demonstrate healing of the proximal phalanx of this displaced fracture.  Minimal tenderness on palpation medial calcaneal tubercle right.  Assessment: Slowly healing fracture fifth digit right foot resolving Planter fasciitis bilateral.  Plan: Discussed etiology pathology and surgical therapies she will continue wrap the toe on a daily basis as long as the swelling is tender.  I will follow-up with her on an as-needed basis.

## 2022-12-10 ENCOUNTER — Ambulatory Visit: Payer: Medicare Other | Admitting: Podiatry

## 2022-12-10 ENCOUNTER — Encounter: Payer: Self-pay | Admitting: Podiatry

## 2022-12-10 DIAGNOSIS — M722 Plantar fascial fibromatosis: Secondary | ICD-10-CM | POA: Diagnosis not present

## 2022-12-10 DIAGNOSIS — S92504D Nondisplaced unspecified fracture of right lesser toe(s), subsequent encounter for fracture with routine healing: Secondary | ICD-10-CM

## 2022-12-10 MED ORDER — TRIAMCINOLONE ACETONIDE 40 MG/ML IJ SUSP
40.0000 mg | Freq: Once | INTRAMUSCULAR | Status: AC
  Administered 2022-12-10: 40 mg

## 2022-12-10 NOTE — Progress Notes (Signed)
She presents today with a flareup of her Planter fasciitis.  States that she turned the other day and her fifth toe really gave her sharp pain from where he was fractured previously.  Objective: Vital signs stable oriented x 3 moderate to severe pain on palpation medial calcaneal tubercle left greater than right.  Fifth toe right is still wrapped with Coban.  Assessment and plan fasciitis left greater than right.  Fracture fifth digit right.  Plan: Reinjected the bilateral heels today with Kenalog and local anesthetic.  We will follow-up with her in 1 month for another set of x-rays on the right foot fifth toe.

## 2023-01-12 ENCOUNTER — Ambulatory Visit: Payer: Medicare Other | Admitting: Podiatry

## 2023-01-12 ENCOUNTER — Ambulatory Visit (INDEPENDENT_AMBULATORY_CARE_PROVIDER_SITE_OTHER): Payer: Medicare Other

## 2023-01-12 DIAGNOSIS — S92504D Nondisplaced unspecified fracture of right lesser toe(s), subsequent encounter for fracture with routine healing: Secondary | ICD-10-CM

## 2023-01-12 NOTE — Progress Notes (Signed)
She presents today for follow-up of her fracture to her fifth digit of the right foot.  States that her Planter fasciitis is doing better and that she does not need another injection she states that she still having numbness across the top of the foot left.  All  Objective: Vital signs are stable alert and oriented x 3.  Fifth digit does not demonstrate any pain on palpation the toe has decreased in size and erythema since last time I saw her.  Radiographs taken today do not demonstrate any type of osseous abnormalities other than a healing fracture of the proximal phalanx.  I would say this is anywhere from 75 to 90% healed at this time.  Assessment: Resolving Planter fasciitis resolving fifth digital fracture.  Plan: Follow-up with [place] on an as-needed basis

## 2023-02-04 ENCOUNTER — Ambulatory Visit: Payer: Medicare Other | Admitting: Podiatry

## 2023-02-04 ENCOUNTER — Ambulatory Visit (INDEPENDENT_AMBULATORY_CARE_PROVIDER_SITE_OTHER): Payer: Medicare Other

## 2023-02-04 DIAGNOSIS — M722 Plantar fascial fibromatosis: Secondary | ICD-10-CM

## 2023-02-04 DIAGNOSIS — S92504D Nondisplaced unspecified fracture of right lesser toe(s), subsequent encounter for fracture with routine healing: Secondary | ICD-10-CM | POA: Diagnosis not present

## 2023-02-04 NOTE — Progress Notes (Signed)
She presents today for follow-up of her fracture fifth digit right foot and flareup of the Planter fasciitis.  States that it really just flared up the other day and the left one seems to be the worst.  States that its about a 5 out of 10 pain level.  Objective: Vital signs are stable alert and oriented x 3.  There is no erythema edema cellulitis drainage or odor she does have pain on palpation medial calcaneal tubercles.  There is no longer any erythema or edema to the fifth digit of the right foot radiographs confirm healing of the proximal phalanx in good position.  Assessment: Recurrence of Planter fasciitis.  Plan: Injected the bilateral heels today 20 mg Kenalog 5 mg Marcaine point of maximal tenderness.  Tolerated procedure well follow-up with her on as-needed basis.  May need to consider MRI of the left foot discussed that today.

## 2023-02-11 ENCOUNTER — Ambulatory Visit: Payer: Medicare Other | Admitting: Podiatry

## 2023-03-27 ENCOUNTER — Other Ambulatory Visit: Payer: Self-pay | Admitting: Nurse Practitioner

## 2023-03-27 DIAGNOSIS — Z1231 Encounter for screening mammogram for malignant neoplasm of breast: Secondary | ICD-10-CM

## 2023-04-02 ENCOUNTER — Ambulatory Visit
Admission: RE | Admit: 2023-04-02 | Discharge: 2023-04-02 | Disposition: A | Discharge status: Home / Self Care | Payer: Medicare Other | Source: Ambulatory Visit | Attending: Nurse Practitioner | Admitting: Nurse Practitioner

## 2023-04-02 DIAGNOSIS — Z1231 Encounter for screening mammogram for malignant neoplasm of breast: Secondary | ICD-10-CM | POA: Insufficient documentation

## 2023-04-09 ENCOUNTER — Telehealth: Payer: Self-pay | Admitting: Podiatry

## 2023-04-09 NOTE — Telephone Encounter (Signed)
Patient is scheduled for surgery on 05/06/23.  She has gotten plantar fasciitis injections in the past, and knows she has to get them at least 3 months apart.  She is wondering if she can get one now because her heel is really hurting.

## 2023-04-09 NOTE — Telephone Encounter (Signed)
Called pt and she is scheduled to see Dr Al Corpus 10/7

## 2023-04-20 ENCOUNTER — Ambulatory Visit: Payer: Medicare Other | Admitting: Podiatry

## 2023-04-21 DIAGNOSIS — Z01818 Encounter for other preprocedural examination: Secondary | ICD-10-CM | POA: Insufficient documentation

## 2023-04-22 DIAGNOSIS — Z9889 Other specified postprocedural states: Secondary | ICD-10-CM | POA: Insufficient documentation

## 2023-04-28 ENCOUNTER — Ambulatory Visit: Payer: Medicare Other | Admitting: Podiatry

## 2023-04-28 DIAGNOSIS — M62469 Contracture of muscle, unspecified lower leg: Secondary | ICD-10-CM | POA: Diagnosis not present

## 2023-04-28 DIAGNOSIS — M722 Plantar fascial fibromatosis: Secondary | ICD-10-CM | POA: Diagnosis not present

## 2023-04-28 NOTE — Progress Notes (Signed)
Subjective:  Patient ID: Catherine Mcclain, female    DOB: 1956-01-07,  MRN: [id number]  No chief complaint on file.   67 y.o. female presents with the above complaint.  Patient presents with bilateral heel pain that has been going for quite some time is progressive gotten worse worse with ambulation worse with pressure she would like to discuss treatment options for her.  She has history of plantar fasciitis she is known to Dr. Al Corpus.  She would like to discuss steroid injection.   Review of Systems: Negative except as noted in the HPI. Denies N/V/F/Ch.  Past Medical History:  Diagnosis Date   Anemia    AS A TEENAGER   Arthritis    Asthma    WELL CONTROLLED   Hyperlipidemia    Hypertension    Hypothyroidism    Obesity    Osteopenia     Current Outpatient Medications:    albuterol (PROAIR HFA) 108 (90 Base) MCG/ACT inhaler, Inhale 2 puffs into the lungs every 4 (four) hours as needed for shortness of breath., Disp: , Rfl:    Ascorbic Acid (VITAMIN C) 1000 MG tablet, Take 1,000 mg by mouth daily. , Disp: , Rfl:    cetirizine (ZYRTEC) 10 MG tablet, Take 10 mg by mouth every morning. , Disp: , Rfl:    cholecalciferol (VITAMIN D3) 25 MCG (1000 UT) tablet, Take 1,000 Units by mouth daily., Disp: , Rfl:    Fluticasone-Salmeterol (ADVAIR) 250-50 MCG/DOSE AEPB, Inhale 1 puff into the lungs 2 (two) times daily. , Disp: , Rfl:    gabapentin (NEURONTIN) 400 MG capsule, Take 1 capsule (400 mg total) by mouth 2 (two) times daily., Disp: 60 capsule, Rfl: 3   Glucosamine-Chondroitin (OSTEO BI-FLEX REGULAR STRENGTH PO), Take 2 tablets by mouth daily., Disp: , Rfl:    levothyroxine (SYNTHROID) 150 MCG tablet, Take 150 mcg by mouth every morning., Disp: , Rfl:    magnesium oxide (MAG-OX) 400 MG tablet, Take 400 mg by mouth daily., Disp: , Rfl:    meloxicam (MOBIC) 15 MG tablet, Take 1 tablet (15 mg total) by mouth daily., Disp: 30 tablet, Rfl: 3   montelukast (SINGULAIR) 10 MG tablet, 1 TABLET,  ORAL, DAILY (Patient taking differently: Take 10 mg by mouth every morning. ), Disp: 90 tablet, Rfl: 1   Multiple Vitamin (MULTI-VITAMINS) TABS, Take 2 tablets by mouth daily., Disp: , Rfl:    olmesartan (BENICAR) 20 MG tablet, Take 20 mg by mouth daily., Disp: , Rfl:    Omega-3 Fatty Acids (FISH OIL) 1000 MG CAPS, Take 1,000 mg by mouth daily., Disp: , Rfl:    OVER THE COUNTER MEDICATION, Take 2 tablets by mouth daily. Vitafusion Calcium, Disp: , Rfl:    rosuvastatin (CRESTOR) 5 MG tablet, Take 5 mg by mouth daily., Disp: , Rfl:    vitamin B-12 (CYANOCOBALAMIN) 500 MCG tablet, Take 1,000 mcg by mouth daily., Disp: , Rfl:   Social History   Tobacco Use  Smoking Status Never  Smokeless Tobacco Never    Allergies  Allergen Reactions   Pollen Extract     Other reaction(s): Not available, Other (See Comments) Sneezing, itching, cough    Short Ragweed Pollen Ext     Other reaction(s): Not available   Objective:  There were no vitals filed for this visit. There is no height or weight on file to calculate BMI. Constitutional Well developed. Well nourished.  Vascular Dorsalis pedis pulses palpable bilaterally. Posterior tibial pulses palpable bilaterally. Capillary refill normal to all  digits.  No cyanosis or clubbing noted. Pedal hair growth normal.  Neurologic Normal speech. Oriented to person, place, and time. Epicritic sensation to light touch grossly present bilaterally.  Dermatologic Nails well groomed and normal in appearance. No open wounds. No skin lesions.  Orthopedic: Normal joint ROM without pain or crepitus bilaterally. No visible deformities. Tender to palpation at the calcaneal tuber bilaterally. No pain with calcaneal squeeze bilaterally. Ankle ROM diminished range of motion bilaterally. Silfverskiold Test: positive bilaterally.   Radiographs: None  Assessment:   1. Plantar fasciitis of right foot   2. Plantar fasciitis of left foot   3. Gastrocnemius  equinus, unspecified laterality    Plan:  Patient was evaluated and treated and all questions answered.  Plantar Fasciitis, bilaterally with underlying gastrocnemius equinus - XR reviewed as above.  - Educated on icing and stretching. Instructions given.  - Injection delivered to the plantar fascia as below. - DME: Plantar fascial brace dispensed to support the medial longitudinal arch of the foot and offload pressure from the heel and prevent arch collapse during weightbearing - Pharmacologic management: None  Procedure: Injection Tendon/Ligament Location: Bilateral plantar fascia at the glabrous junction; medial approach. Skin Prep: alcohol Injectate: 0.5 cc 0.5% marcaine plain, 0.5 cc of 1% Lidocaine, 0.5 cc kenalog 10. Disposition: Patient tolerated procedure well. Injection site dressed with a band-aid.  No follow-ups on file.

## 2023-04-29 DIAGNOSIS — M19019 Primary osteoarthritis, unspecified shoulder: Secondary | ICD-10-CM | POA: Insufficient documentation

## 2023-05-18 ENCOUNTER — Emergency Department: Payer: Medicare Other

## 2023-05-18 ENCOUNTER — Encounter: Payer: Self-pay | Admitting: *Deleted

## 2023-05-18 ENCOUNTER — Other Ambulatory Visit: Payer: Self-pay

## 2023-05-18 ENCOUNTER — Emergency Department
Admission: EM | Admit: 2023-05-18 | Discharge: 2023-05-18 | Discharge status: Against Medical Advice | Payer: Medicare Other | Attending: Emergency Medicine | Admitting: Emergency Medicine

## 2023-05-18 DIAGNOSIS — Z5321 Procedure and treatment not carried out due to patient leaving prior to being seen by health care provider: Secondary | ICD-10-CM | POA: Insufficient documentation

## 2023-05-18 DIAGNOSIS — R0602 Shortness of breath: Secondary | ICD-10-CM | POA: Insufficient documentation

## 2023-05-18 LAB — BASIC METABOLIC PANEL
Anion gap: 10 (ref 5–15)
BUN: 20 mg/dL (ref 8–23)
CO2: 25 mmol/L (ref 22–32)
Calcium: 8.8 mg/dL — ABNORMAL LOW (ref 8.9–10.3)
Chloride: 101 mmol/L (ref 98–111)
Creatinine, Ser: 0.77 mg/dL (ref 0.44–1.00)
GFR, Estimated: >60 mL/min (ref >60)
Glucose, Bld: 114 mg/dL — ABNORMAL HIGH (ref 70–99)
Potassium: 4.1 mmol/L (ref 3.5–5.1)
Sodium: 136 mmol/L (ref 135–145)

## 2023-05-18 LAB — CBC
HCT: 41.5 % (ref 36.0–46.0)
Hemoglobin: 13.6 g/dL (ref 12.0–15.0)
MCH: 32.9 pg (ref 26.0–34.0)
MCHC: 32.8 g/dL (ref 30.0–36.0)
MCV: 100.5 fL — ABNORMAL HIGH (ref 80.0–100.0)
Platelets: 195 10*3/uL (ref 150–400)
RBC: 4.13 MIL/uL (ref 3.87–5.11)
RDW: 12.9 % (ref 11.5–15.5)
WBC: 6.4 10*3/uL (ref 4.0–10.5)
nRBC: 0 % (ref 0.0–0.2)

## 2023-05-18 LAB — TROPONIN I (HIGH SENSITIVITY)
Troponin I (High Sensitivity): 4 ng/L (ref <18)
Troponin I (High Sensitivity): 4 ng/L (ref <18)

## 2023-05-18 NOTE — ED Triage Notes (Signed)
Pt to triage via wheelchair.  Pt had surgery last week on right shoulder.  Pt reports sob since the surgery.  No chest pain.  No n/v  no diaphoresis.  Pt alert  speech clear.

## 2023-05-19 ENCOUNTER — Telehealth: Payer: Self-pay | Admitting: Emergency Medicine

## 2023-05-19 NOTE — Telephone Encounter (Signed)
Called patient due to left emergency department before provider exam to inquire about condition and follow up plans. Left message. 

## 2023-05-29 IMAGING — MG MM DIGITAL SCREENING BILAT W/ TOMO AND CAD
8 series · 8 of 24 positions shown · non-contrast
Comparison: Previous exam(s).

CLINICAL DATA: Screening.

EXAM:
DIGITAL SCREENING BILATERAL MAMMOGRAM WITH TOMOSYNTHESIS AND CAD
TECHNIQUE: Bilateral screening digital craniocaudal and mediolateral oblique
mammograms were obtained. Bilateral screening digital breast
tomosynthesis was performed. The images were evaluated with
computer-aided detection.

[R CC synth-2D]
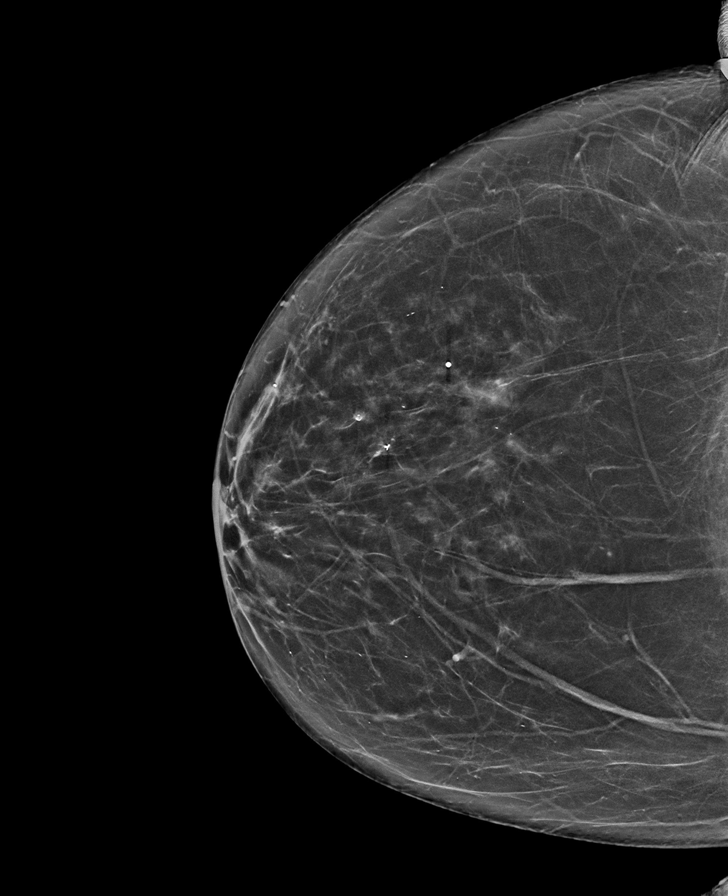

[L CC synth-2D]
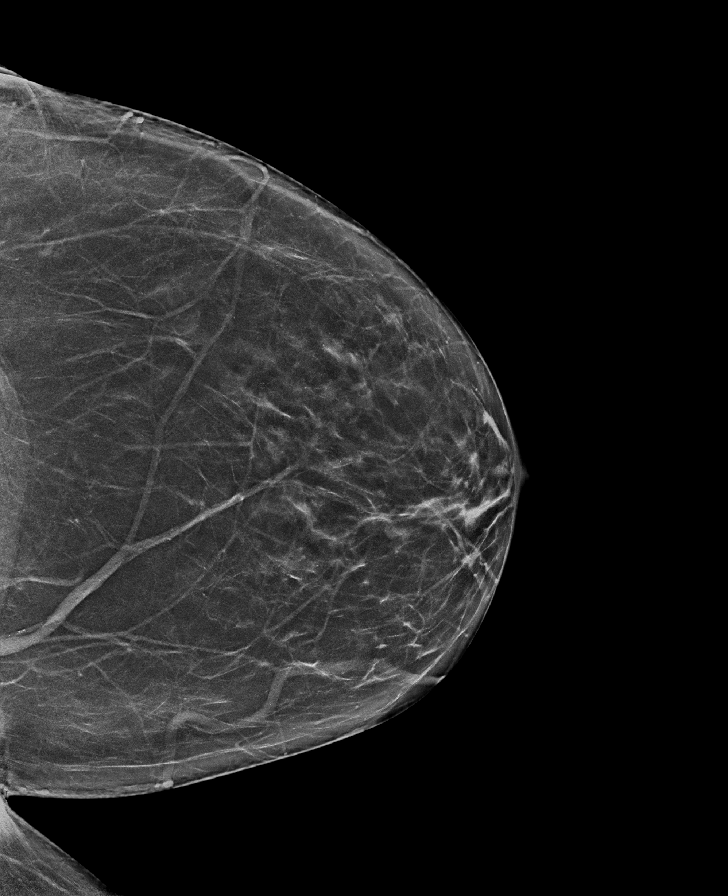

[L MLO synth-2D]
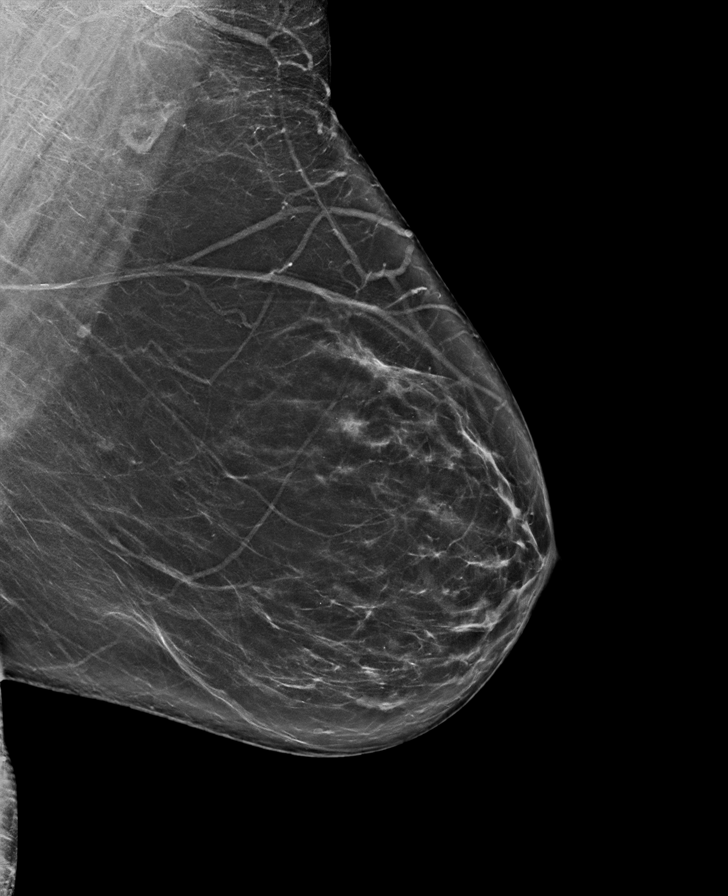

[R MLO synth-2D]
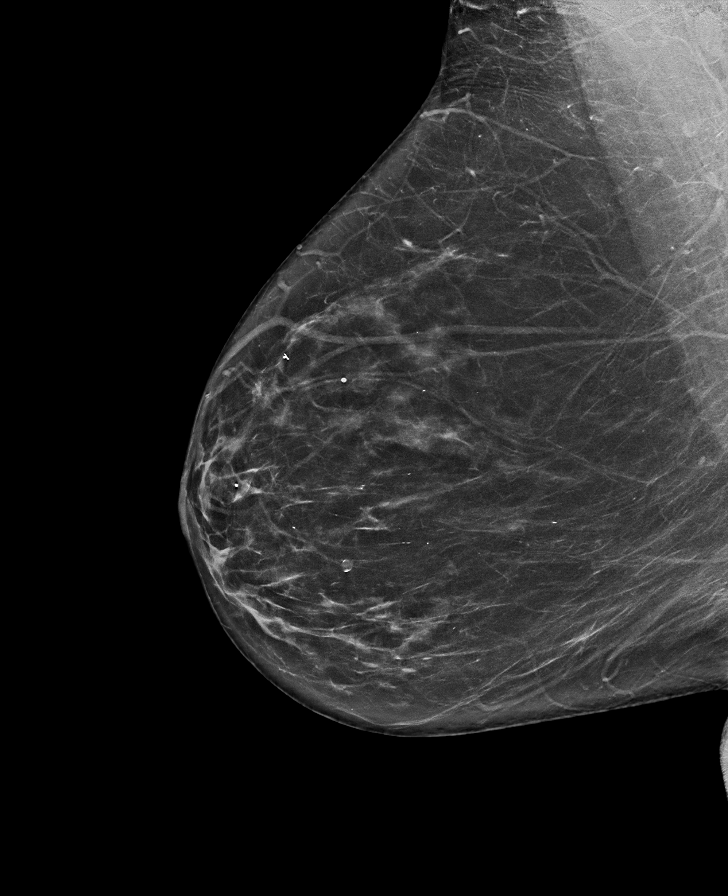

[L CC tomo · tomo slice 35/68.0]
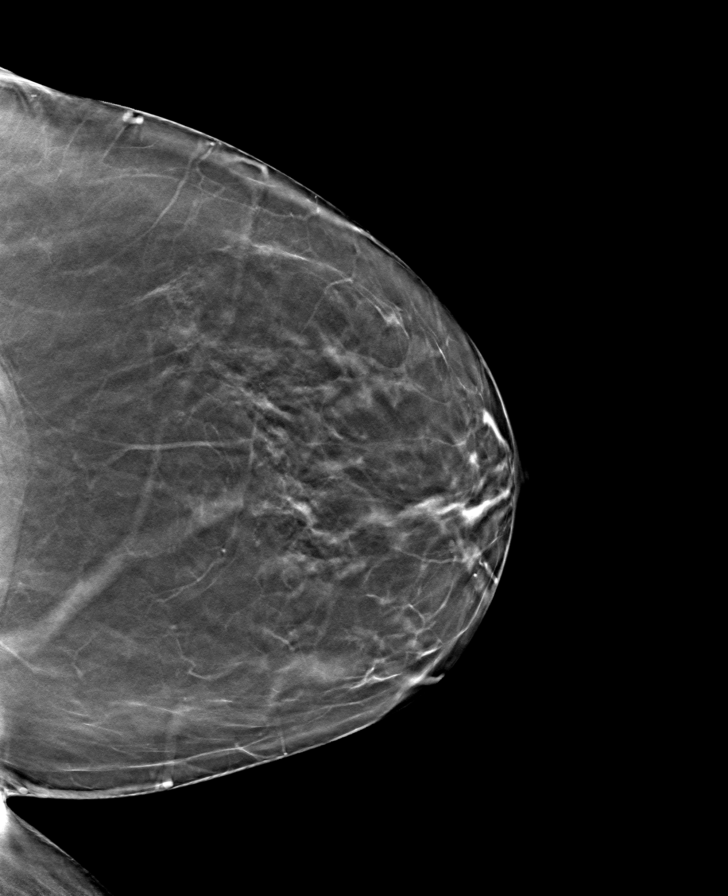

[R MLO tomo · tomo slice 39/78.0]
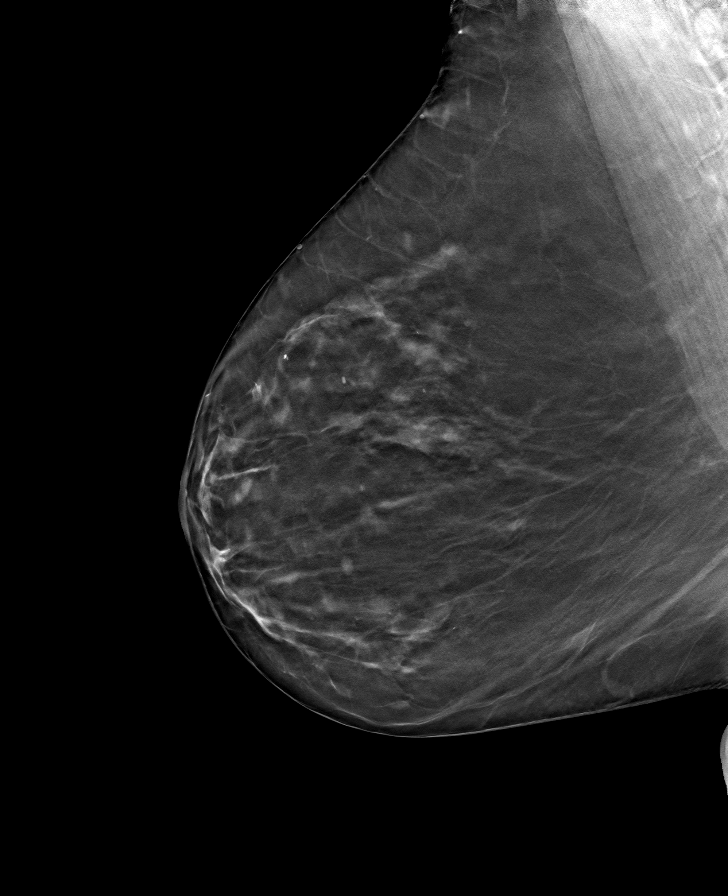

[R CC tomo · tomo slice 35/69.0]
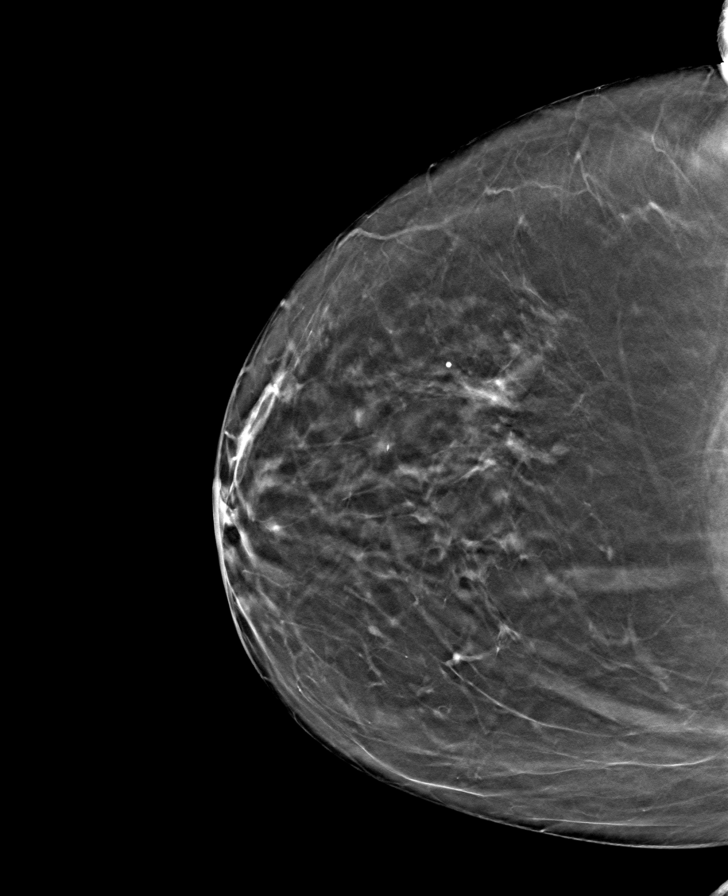

[L MLO tomo · tomo slice 43/84.0]
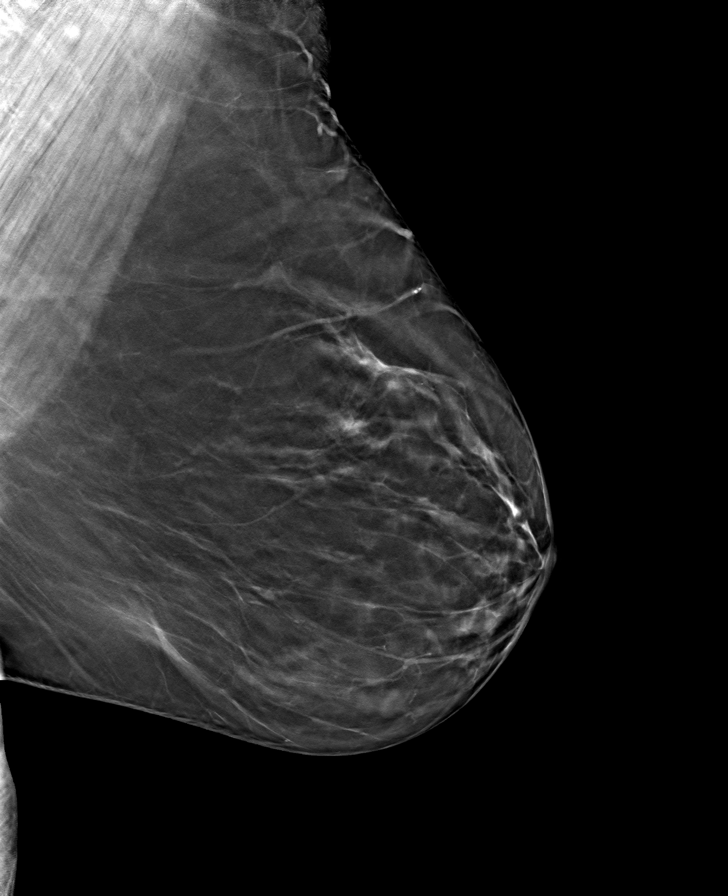

[8 of 24 positions shown; findings below may reference images not displayed]

ACR Breast Density Category b: There are scattered areas of
fibroglandular density.
FINDINGS: There are no findings suspicious for malignancy.
IMPRESSION: No mammographic evidence of malignancy. A result letter of this
screening mammogram will be mailed directly to the patient.

RECOMMENDATION:
Screening mammogram in one year. (Code:51-O-LD2)

BI-RADS CATEGORY  1: Negative.

## 2023-06-29 ENCOUNTER — Ambulatory Visit: Payer: Medicare Other | Admitting: Podiatry

## 2023-06-29 ENCOUNTER — Encounter: Payer: Self-pay | Admitting: Podiatry

## 2023-06-29 DIAGNOSIS — M722 Plantar fascial fibromatosis: Secondary | ICD-10-CM | POA: Diagnosis not present

## 2023-06-29 DIAGNOSIS — S93692A Other sprain of left foot, initial encounter: Secondary | ICD-10-CM

## 2023-06-29 MED ORDER — TRIAMCINOLONE ACETONIDE 40 MG/ML IJ SUSP
40.0000 mg | Freq: Once | INTRAMUSCULAR | Status: AC
  Administered 2023-06-29: 40 mg

## 2023-06-29 NOTE — Progress Notes (Signed)
She presents today with pain to her left foot greater than that of the right.  She is also complaining of painful ingrown toenails.  States that this Planter fasciitis is just not resolving.  Objective: Vital signs are stable alert oriented x 3.  Pulses are palpable.  She has a nonpalpable medial band of the plantar fascia left possible rupture.  Right foot does demonstrate continued pain on palpation as does the left heel.  Toenails are sharply incurvated tibial border of the hallux left fibular border the hallux right.  Assessment: Pain in limb secondary to Planter fasciitis possible rupture of the plantar fascia left foot.  Ingrown toenails hallux bilateral.  Plan: Requesting an MRI of the left foot to evaluate for plantar fascial rupture.  Injected the bilateral heels today 10 mg Kenalog 5 mg Marcaine.  If we have to do surgery in the near future then we will go ahead and perform chemical matrixectomy's to both great toenails at that time.

## 2023-07-13 ENCOUNTER — Ambulatory Visit
Admission: RE | Admit: 2023-07-13 | Discharge: 2023-07-13 | Disposition: A | Discharge status: Home / Self Care | Payer: Medicare Other | Source: Ambulatory Visit | Attending: Podiatry | Admitting: Podiatry

## 2023-07-13 DIAGNOSIS — S93692A Other sprain of left foot, initial encounter: Secondary | ICD-10-CM

## 2023-08-23 NOTE — H&P (Signed)
 Pre-Procedure H&P   Patient ID: Catherine Mcclain is a 68 y.o. female.  Gastroenterology Provider: Elspeth Ozell Jungling, DO  Referring Provider: Lauraine Leak, NP PCP: Leak Lauraine Collar, NP  Date: 08/24/2023  HPI Ms. Catherine Mcclain is a 68 y.o. female who presents today for Colonoscopy for Personal and family history of colon polyps .  Last underwent colonoscopy in 2019 demonstrating adenomatous polyp.  Negative colonoscopy in 2009 only demonstrating diverticulosis.  No family history of colon cancer.  Brother with history of colon polyps  Daily to every other day bowel movement without melena or hematochezia.  Most recent labs hemoglobin 13.6 MCV 98 platelets 204,000 creatinine 0.9   Past Medical History:  Diagnosis Date   Anemia    AS A TEENAGER   Arthritis    Asthma    WELL CONTROLLED   Hyperlipidemia    Hypertension    Hypothyroidism    Obesity    Osteopenia     Past Surgical History:  Procedure Laterality Date   BREAST BIOPSY Right 10/2010   NEG   BREAST BIOPSY Left 03/21/2022   Stereo Bx, X clip, benign   BREAST BIOPSY Left 03/21/2022   Stereo bx, coil clip, benign   COLONOSCOPY WITH PROPOFOL  N/A 05/03/2018   Procedure: COLONOSCOPY WITH PROPOFOL ;  Surgeon: Gaylyn Gladis PENNER, MD;  Location: [facility] ENDOSCOPY;  Service: Endoscopy;  Laterality: N/A;   FUNCTIONAL ENDOSCOPIC SINUS SURGERY     SHOULDER ARTHROSCOPY WITH OPEN ROTATOR CUFF REPAIR Left 09/27/2018   Procedure: SHOULDER ARTHROSCOPY WITH DEBRIDEMENT, LEFT;  Surgeon: Cleotilde Barrio, MD;  Location: [facility] ORS;  Service: Orthopedics;  Laterality: Left;   SHOULDER ARTHROSCOPY WITH ROTATOR CUFF REPAIR Right 08/02/2018   Procedure: SHOULDER ARTHROSCOPY , subacromial deompression, distal clavicle excision, biceps release;  Surgeon: Cleotilde Barrio, MD;  Location: [facility] ORS;  Service: Orthopedics;  Laterality: Right;   THYROID LOBECTOMY      Family History 1 Brother-colon polyps, 1 Brother -Barrett's  esophagus No other h/o GI disease or malignancy  Review of Systems  Constitutional:  Negative for activity change, appetite change, chills, diaphoresis, fatigue, fever and unexpected weight change.  HENT:  Negative for trouble swallowing and voice change.   Respiratory:  Negative for shortness of breath and wheezing.   Cardiovascular:  Negative for chest pain, palpitations and leg swelling.  Gastrointestinal:  Negative for abdominal distention, abdominal pain, anal bleeding, blood in stool, constipation, diarrhea, nausea, rectal pain and vomiting.  Musculoskeletal:  Negative for arthralgias and myalgias.  Skin:  Negative for color change and pallor.  Neurological:  Negative for dizziness, syncope and weakness.  Psychiatric/Behavioral:  Negative for confusion.   All other systems reviewed and are negative.    Medications No current facility-administered medications on file prior to encounter.   Current Outpatient Medications on File Prior to Encounter  Medication Sig Dispense Refill   albuterol (PROAIR HFA) 108 (90 Base) MCG/ACT inhaler Inhale 2 puffs into the lungs every 4 (four) hours as needed for shortness of breath.     Fluticasone-Salmeterol (ADVAIR) 250-50 MCG/DOSE AEPB Inhale 1 puff into the lungs 2 (two) times daily.      Ascorbic Acid (VITAMIN C) 1000 MG tablet Take 1,000 mg by mouth daily.      cetirizine (ZYRTEC) 10 MG tablet Take 10 mg by mouth every morning.      cholecalciferol (VITAMIN D3) 25 MCG (1000 UT) tablet Take 1,000 Units by mouth daily.     gabapentin  (NEURONTIN ) 400 MG capsule Take 1 capsule (  400 mg total) by mouth 2 (two) times daily. (Patient not taking: Reported on 08/24/2023) 60 capsule 3   Glucosamine-Chondroitin (OSTEO BI-FLEX REGULAR STRENGTH PO) Take 2 tablets by mouth daily.     levothyroxine (SYNTHROID) 150 MCG tablet Take 150 mcg by mouth every morning.     magnesium oxide (MAG-OX) 400 MG tablet Take 400 mg by mouth daily.     meloxicam  (MOBIC ) 15 MG  tablet Take 1 tablet (15 mg total) by mouth daily. 30 tablet 3   montelukast  (SINGULAIR ) 10 MG tablet 1 TABLET, ORAL, DAILY (Patient taking differently: Take 10 mg by mouth every morning.) 90 tablet 1   Multiple Vitamin (MULTI-VITAMINS) TABS Take 2 tablets by mouth daily.     olmesartan (BENICAR) 20 MG tablet Take 20 mg by mouth daily.     Omega-3 Fatty Acids (FISH OIL) 1000 MG CAPS Take 1,000 mg by mouth daily.     OVER THE COUNTER MEDICATION Take 2 tablets by mouth daily. Vitafusion Calcium     rosuvastatin (CRESTOR) 5 MG tablet Take 5 mg by mouth daily.     vitamin B-12 (CYANOCOBALAMIN) 500 MCG tablet Take 1,000 mcg by mouth daily.      Pertinent medications related to GI and procedure were reviewed by me with the patient prior to the procedure   Current Facility-Administered Medications:    0.9 %  sodium chloride  infusion, , Intravenous, Continuous, Onita Elspeth Sharper, DO, Last Rate: 20 mL/hr at 08/24/23 0845, Continued from Pre-op at 08/24/23 0845  sodium chloride  20 mL/hr at 08/24/23 0845       Allergies  Allergen Reactions   Methocarbamol Shortness Of Breath   Pollen Extract     Other reaction(s): Not available, Other (See Comments) Sneezing, itching, cough    Short Ragweed Pollen Ext     Other reaction(s): Not available   Allergies were reviewed by me prior to the procedure  Objective   Body mass index is 33.2 kg/m. Vitals:   08/24/23 0820  BP: 129/72  Pulse: 78  Resp: 18  Temp: (!) 97.2 F (36.2 C)  TempSrc: Temporal  SpO2: 94%  Weight: 96.2 kg  Height: 5' 7 (1.702 m)     Physical Exam Vitals and nursing note reviewed.  Constitutional:      General: She is not in acute distress.    Appearance: Normal appearance. She is obese. She is not ill-appearing, toxic-appearing or diaphoretic.  HENT:     Head: Normocephalic and atraumatic.     Nose: Nose normal.     Mouth/Throat:     Mouth: Mucous membranes are moist.     Pharynx: Oropharynx is clear.   Eyes:     General: No scleral icterus.    Extraocular Movements: Extraocular movements intact.  Cardiovascular:     Rate and Rhythm: Normal rate and regular rhythm.     Heart sounds: Normal heart sounds. No murmur heard.    No friction rub. No gallop.  Pulmonary:     Effort: Pulmonary effort is normal. No respiratory distress.     Breath sounds: Normal breath sounds. No wheezing, rhonchi or rales.  Abdominal:     General: Bowel sounds are normal. There is no distension.     Palpations: Abdomen is soft.     Tenderness: There is no abdominal tenderness. There is no guarding or rebound.  Musculoskeletal:     Cervical back: Neck supple.     Right lower leg: No edema.     Left lower leg:  No edema.  Skin:    General: Skin is warm and dry.     Coloration: Skin is not jaundiced or pale.  Neurological:     General: No focal deficit present.     Mental Status: She is alert and oriented to person, place, and time. Mental status is at baseline.  Psychiatric:        Mood and Affect: Mood normal.        Behavior: Behavior normal.        Thought Content: Thought content normal.        Judgment: Judgment normal.      Assessment:  Ms. Catherine Mcclain is a 68 y.o. female  who presents today for Colonoscopy for Personal and family history of colon polyps .  Plan:  Colonoscopy with possible intervention today  Colonoscopy with possible biopsy, control of bleeding, polypectomy, and interventions as necessary has been discussed with the patient/patient representative. Informed consent was obtained from the patient/patient representative after explaining the indication, nature, and risks of the procedure including but not limited to death, bleeding, perforation, missed neoplasm/lesions, cardiorespiratory compromise, and reaction to medications. Opportunity for questions was given and appropriate answers were provided. Patient/patient representative has verbalized understanding is amenable to  undergoing the procedure.   Elspeth Ozell Jungling, DO  [facility]  Portions of the record may have been created with voice recognition software. Occasional wrong-word or 'sound-a-like' substitutions may have occurred due to the inherent limitations of voice recognition software.  Read the chart carefully and recognize, using context, where substitutions may have occurred.

## 2023-08-24 ENCOUNTER — Ambulatory Visit
Admission: RE | Admit: 2023-08-24 | Discharge: 2023-08-24 | Disposition: A | Discharge status: Home / Self Care | Payer: Medicare Other | Attending: Gastroenterology | Admitting: Gastroenterology

## 2023-08-24 ENCOUNTER — Encounter
Admission: RE | Disposition: A | Discharge status: Home / Self Care | Payer: Self-pay | Source: Home / Self Care | Attending: Gastroenterology

## 2023-08-24 ENCOUNTER — Encounter: Payer: Self-pay | Admitting: Gastroenterology

## 2023-08-24 ENCOUNTER — Ambulatory Visit: Payer: Medicare Other | Admitting: Anesthesiology

## 2023-08-24 ENCOUNTER — Other Ambulatory Visit: Payer: Self-pay

## 2023-08-24 DIAGNOSIS — Z83719 Family history of colon polyps, unspecified: Secondary | ICD-10-CM | POA: Diagnosis not present

## 2023-08-24 DIAGNOSIS — Z860101 Personal history of adenomatous and serrated colon polyps: Secondary | ICD-10-CM | POA: Diagnosis present

## 2023-08-24 DIAGNOSIS — I10 Essential (primary) hypertension: Secondary | ICD-10-CM | POA: Insufficient documentation

## 2023-08-24 DIAGNOSIS — D124 Benign neoplasm of descending colon: Secondary | ICD-10-CM | POA: Insufficient documentation

## 2023-08-24 DIAGNOSIS — M1990 Unspecified osteoarthritis, unspecified site: Secondary | ICD-10-CM | POA: Insufficient documentation

## 2023-08-24 DIAGNOSIS — K5730 Diverticulosis of large intestine without perforation or abscess without bleeding: Secondary | ICD-10-CM | POA: Insufficient documentation

## 2023-08-24 DIAGNOSIS — J45909 Unspecified asthma, uncomplicated: Secondary | ICD-10-CM | POA: Diagnosis not present

## 2023-08-24 DIAGNOSIS — E039 Hypothyroidism, unspecified: Secondary | ICD-10-CM | POA: Diagnosis not present

## 2023-08-24 DIAGNOSIS — D123 Benign neoplasm of transverse colon: Secondary | ICD-10-CM | POA: Diagnosis not present

## 2023-08-24 DIAGNOSIS — Z1211 Encounter for screening for malignant neoplasm of colon: Secondary | ICD-10-CM | POA: Diagnosis not present

## 2023-08-24 HISTORY — PX: POLYPECTOMY: SHX5525

## 2023-08-24 HISTORY — PX: COLONOSCOPY: SHX5424

## 2023-08-24 SURGERY — COLONOSCOPY
Anesthesia: General

## 2023-08-24 MED ORDER — PROPOFOL 500 MG/50ML IV EMUL
INTRAVENOUS | Status: DC | PRN
  Administered 2023-08-24: 75 ug/kg/min via INTRAVENOUS

## 2023-08-24 MED ORDER — PROPOFOL 10 MG/ML IV BOLUS
INTRAVENOUS | Status: DC | PRN
  Administered 2023-08-24: 50 mg via INTRAVENOUS
  Administered 2023-08-24: 30 mg via INTRAVENOUS
  Administered 2023-08-24: 20 mg via INTRAVENOUS

## 2023-08-24 MED ORDER — SODIUM CHLORIDE 0.9 % IV SOLN: INTRAVENOUS | Status: DC

## 2023-08-24 MED ORDER — LIDOCAINE HCL (PF) 2 % IJ SOLN
INTRAMUSCULAR | Status: AC
  Filled 2023-08-24: qty 5

## 2023-08-24 MED ORDER — DEXMEDETOMIDINE HCL IN NACL 80 MCG/20ML IV SOLN
INTRAVENOUS | Status: AC
Start: 2023-08-24 — End: ?
  Filled 2023-08-24: qty 20

## 2023-08-24 MED ORDER — PROPOFOL 1000 MG/100ML IV EMUL
INTRAVENOUS | Status: AC
  Filled 2023-08-24: qty 100

## 2023-08-24 MED ORDER — LIDOCAINE HCL (CARDIAC) PF 100 MG/5ML IV SOSY
PREFILLED_SYRINGE | INTRAVENOUS | Status: DC | PRN
  Administered 2023-08-24: 80 mg via INTRAVENOUS

## 2023-08-24 MED ORDER — STERILE WATER FOR IRRIGATION IR SOLN
Status: DC | PRN
  Administered 2023-08-24: 120 mL

## 2023-08-24 MED ORDER — DEXMEDETOMIDINE HCL IN NACL 80 MCG/20ML IV SOLN
INTRAVENOUS | Status: DC | PRN
  Administered 2023-08-24: 20 ug via INTRAVENOUS

## 2023-08-24 NOTE — Op Note (Signed)
 [facility] Gastroenterology Patient Name: Catherine Mcclain Procedure Date: 08/24/2023 9:06 AM MRN: [id number] Account #: [id number] Date of Birth: 1956-01-09 Admit Type: Outpatient Age: 68 Room: [facility] ENDO ROOM 2 Gender: Female Note Status: Finalized Instrument Name: Colonoscope [id number] Procedure:             Colonoscopy Indications:           High risk colon cancer surveillance: Personal history                         of colonic polyps Providers:             Bridgett Camps, DO Referring MD:          Samual Crochet (Referring MD) Medicines:             Monitored Anesthesia Care Complications:         No immediate complications. Estimated blood loss:                         Minimal. Procedure:             Pre-Anesthesia Assessment:                        - Prior to the procedure, a History and Physical was                         performed, and patient medications and allergies were                         reviewed. The patient is competent. The risks and                         benefits of the procedure and the sedation options and                         risks were discussed with the patient. All questions                         were answered and informed consent was obtained.                         Patient identification and proposed procedure were                         verified by the physician, the nurse, the anesthetist                         and the technician in the endoscopy suite. Mental                         Status Examination: alert and oriented. Airway                         Examination: normal oropharyngeal airway and neck                         mobility. Respiratory Examination: clear to  auscultation. CV Examination: RRR, no murmurs, no S3                         or S4. Prophylactic Antibiotics: The patient does not                         require prophylactic antibiotics. Prior                          Anticoagulants: The patient has taken no anticoagulant                         or antiplatelet agents. ASA Grade Assessment: II - A                         patient with mild systemic disease. After reviewing                         the risks and benefits, the patient was deemed in                         satisfactory condition to undergo the procedure. The                         anesthesia plan was to use monitored anesthesia care                         (MAC). Immediately prior to administration of                         medications, the patient was re-assessed for adequacy                         to receive sedatives. The heart rate, respiratory                         rate, oxygen saturations, blood pressure, adequacy of                         pulmonary ventilation, and response to care were                         monitored throughout the procedure. The physical                         status of the patient was re-assessed after the                         procedure.                        After obtaining informed consent, the colonoscope was                         passed under direct vision. Throughout the procedure,                         the patient's blood pressure, pulse, and oxygen  saturations were monitored continuously. The                         Colonoscope was introduced through the anus and                         advanced to the the cecum, identified by appendiceal                         orifice and ileocecal valve. The colonoscopy was                         performed without difficulty. The patient tolerated                         the procedure well. The quality of the bowel                         preparation was evaluated using the BBPS [facility] Bowel                         Preparation Scale) with scores of: Right Colon = 2                         (minor amount of residual staining, small fragments of                         stool and/or  opaque liquid, but mucosa seen well),                         Transverse Colon = 3 (entire mucosa seen well with no                         residual staining, small fragments of stool or opaque                         liquid) and Left Colon = 2 (minor amount of residual                         staining, small fragments of stool and/or opaque                         liquid, but mucosa seen well). The total BBPS score                         equals 7. The quality of the bowel preparation was                         good. The ileocecal valve, appendiceal orifice, and                         rectum were photographed. Findings:      The perianal exam findings include excoriation above, but outside the       anus. otherwise normal.      The digital rectal exam was normal. Pertinent negatives include normal       sphincter tone.      Four  sessile polyps were found in the descending colon (2) and       transverse colon (2). The polyps were 1 to 2 mm in size. These polyps       were removed with a jumbo cold forceps. Resection and retrieval were       complete. Estimated blood loss was minimal.      Multiple small-mouthed diverticula were found in the sigmoid colon.       Estimated blood loss: none.      The exam was otherwise without abnormality on direct and retroflexion       views. Impression:            - Excoriation above, but outside the anus. otherwise                         normal. found on perianal exam.                        - Four 1 to 2 mm polyps in the descending colon and in                         the transverse colon, removed with a jumbo cold                         forceps. Resected and retrieved.                        - Diverticulosis in the sigmoid colon.                        - The examination was otherwise normal on direct and                         retroflexion views. Recommendation:        - Patient has a contact number available for                          emergencies. The signs and symptoms of potential                         delayed complications were discussed with the patient.                         Return to normal activities tomorrow. Written                         discharge instructions were provided to the patient.                        - Discharge patient to home.                        - Resume previous diet.                        - Continue present medications.                        - Await pathology results.                        -  Repeat colonoscopy for surveillance based on                         pathology results.                        - Return to referring physician as previously                         scheduled.                        - The findings and recommendations were discussed with                         the patient. Procedure Code(s):     --- Professional ---                        [id number], Colonoscopy, flexible; with biopsy, single or                         multiple Diagnosis Code(s):     --- Professional ---                        Z86.010, Personal history of colonic polyps                        D12.4, Benign neoplasm of descending colon                        D12.3, Benign neoplasm of transverse colon (hepatic                         flexure or splenic flexure)                        K57.30, Diverticulosis of large intestine without                         perforation or abscess without bleeding CPT copyright 2022 American Medical Association. All rights reserved. The codes documented in this report are preliminary and upon coder review may  be revised to meet current compliance requirements. Attending Participation:      I personally performed the entire procedure. Polo Brisk, DO Quintin Buckle DO, DO 08/24/2023 9:48:57 AM This report has been signed electronically. Number of Addenda: 0 Note Initiated On: 08/24/2023 9:06 AM Scope Withdrawal Time: 0 hours 13 minutes 37 seconds  Total  Procedure Duration: 0 hours 22 minutes 4 seconds  Estimated Blood Loss:  Estimated blood loss was minimal.      [facility]

## 2023-08-24 NOTE — Anesthesia Postprocedure Evaluation (Signed)
 Anesthesia Post Note  Patient: Catherine Mcclain  Procedure(s) Performed: COLONOSCOPY POLYPECTOMY  Patient location during evaluation: PACU Anesthesia Type: General Level of consciousness: awake and alert Pain management: pain level controlled Vital Signs Assessment: post-procedure vital signs reviewed and stable Respiratory status: spontaneous breathing, nonlabored ventilation and respiratory function stable Cardiovascular status: blood pressure returned to baseline and stable Postop Assessment: no apparent nausea or vomiting Anesthetic complications: no   No notable events documented.   Last Vitals:  Vitals:   08/24/23 0956 08/24/23 1005  BP: (!) 100/54 (!) 111/57  Pulse: 77 73  Resp: 19 20  Temp:    SpO2: 97% 99%    Last Pain:  Vitals:   08/24/23 1005  TempSrc:   PainSc: 0-No pain                 Baltazar Bonier

## 2023-08-24 NOTE — Anesthesia Preprocedure Evaluation (Addendum)
 Anesthesia Evaluation  Patient identified by MRN, date of birth, ID band Patient awake    Reviewed: Allergy & Precautions, H&P , NPO status , Patient's Chart, lab work & pertinent test results  Airway Mallampati: II  TM Distance: >3 FB Neck ROM: full    Dental no notable dental hx.    Pulmonary asthma    Pulmonary exam normal        Cardiovascular hypertension, Normal cardiovascular exam     Neuro/Psych negative neurological ROS  negative psych ROS   GI/Hepatic negative GI ROS, Neg liver ROS,,,  Endo/Other  Hypothyroidism    Renal/GU negative Renal ROS  negative genitourinary   Musculoskeletal  (+) Arthritis ,    Abdominal  (+) + obese  Peds  Hematology negative hematology ROS (+)   Anesthesia Other Findings Past Medical History: No date: Anemia     Comment:  AS A TEENAGER No date: Arthritis No date: Asthma     Comment:  WELL CONTROLLED No date: Hyperlipidemia No date: Hypertension No date: Hypothyroidism No date: Obesity No date: Osteopenia  Past Surgical History: 10/2010: BREAST BIOPSY; Right     Comment:  NEG 03/21/2022: BREAST BIOPSY; Left     Comment:  Stereo Bx, X clip, benign 03/21/2022: BREAST BIOPSY; Left     Comment:  Stereo bx, coil clip, benign 05/03/2018: COLONOSCOPY WITH PROPOFOL ; N/A     Comment:  Procedure: COLONOSCOPY WITH PROPOFOL ;  Surgeon:               Deveron Fly, MD;  Location: [facility] ENDOSCOPY;                Service: Endoscopy;  Laterality: N/A; No date: FUNCTIONAL ENDOSCOPIC SINUS SURGERY 09/27/2018: SHOULDER ARTHROSCOPY WITH OPEN ROTATOR CUFF REPAIR; Left     Comment:  Procedure: SHOULDER ARTHROSCOPY WITH DEBRIDEMENT, LEFT;               Surgeon: Marlynn Singer, MD;  Location: [facility] ORS;                Service: Orthopedics;  Laterality: Left; 08/02/2018: SHOULDER ARTHROSCOPY WITH ROTATOR CUFF REPAIR; Right     Comment:  Procedure: SHOULDER ARTHROSCOPY , subacromial                deompression, distal clavicle excision, biceps release;                Surgeon: Marlynn Singer, MD;  Location: [facility] ORS;                Service: Orthopedics;  Laterality: Right; No date: THYROID  LOBECTOMY     Reproductive/Obstetrics negative OB ROS                             Anesthesia Physical Anesthesia Plan  ASA: 2  Anesthesia Plan: General   Post-op Pain Management: Minimal or no pain anticipated   Induction: Intravenous  PONV Risk Score and Plan: Propofol  infusion and TIVA  Airway Management Planned: Natural Airway  Additional Equipment:   Intra-op Plan:   Post-operative Plan:   Informed Consent: I have reviewed the patients History and Physical, chart, labs and discussed the procedure including the risks, benefits and alternatives for the proposed anesthesia with the patient or authorized representative who has indicated his/her understanding and acceptance.     Dental Advisory Given  Plan Discussed with: CRNA and Surgeon  Anesthesia Plan Comments:         Anesthesia  Quick Evaluation

## 2023-08-24 NOTE — Interval H&P Note (Signed)
 History and Physical Interval Note: Preprocedure H&P from 08/24/23  was reviewed and there was no interval change after seeing and examining the patient.  Written consent was obtained from the patient after discussion of risks, benefits, and alternatives. Patient has consented to proceed with Colonoscopy with possible intervention   08/24/2023 9:14 AM  Catherine Mcclain  has presented today for surgery, with the diagnosis of Personal history of adenomatous and serrated colon polyps (Z86.0101).  The various methods of treatment have been discussed with the patient and family. After consideration of risks, benefits and other options for treatment, the patient has consented to  Procedure(s): COLONOSCOPY (N/A) as a surgical intervention.  The patient's history has been reviewed, patient examined, no change in status, stable for surgery.  I have reviewed the patient's chart and labs.  Questions were answered to the patient's satisfaction.     Quintin Buckle

## 2023-08-24 NOTE — Transfer of Care (Signed)
 Immediate Anesthesia Transfer of Care Note  Patient: Catherine Mcclain  Procedure(s) Performed: COLONOSCOPY POLYPECTOMY  Patient Location: PACU  Anesthesia Type:General  Level of Consciousness: sedated  Airway & Oxygen Therapy: Patient Spontanous Breathing  Post-op Assessment: Report given to RN and Post -op Vital signs reviewed and stable  Post vital signs: Reviewed and stable  Last Vitals:  Vitals Value Taken Time  BP    Temp    Pulse 84 08/24/23 0946  Resp 21 08/24/23 0946  SpO2 94 % 08/24/23 0946  Vitals shown include unfiled device data.  Last Pain:  Vitals:   08/24/23 0820  TempSrc: Temporal  PainSc: 0-No pain         Complications: No notable events documented.

## 2023-08-25 ENCOUNTER — Encounter: Payer: Self-pay | Admitting: Gastroenterology

## 2023-08-25 LAB — SURGICAL PATHOLOGY

## 2023-09-02 ENCOUNTER — Ambulatory Visit: Payer: Medicare Other | Admitting: Podiatry

## 2023-09-02 DIAGNOSIS — S93692D Other sprain of left foot, subsequent encounter: Secondary | ICD-10-CM

## 2023-09-02 DIAGNOSIS — L600 Ingrowing nail: Secondary | ICD-10-CM

## 2023-09-02 NOTE — Progress Notes (Unsigned)
She presents today for surgical consult regarding her MRI and the chronic pain to her left heel.  She also relates that she has an ingrown toenail to the tibial border of the hallux right and the tibial and fibular border of the hallux left.  She states that her pain is ever-increasing and has not improved at all.  Past medical history was reviewed.  Denies fever chills nausea vomit muscle aches pains calf pain back pain chest pain shortness of breath.  Past surgical history reviewed  Allergies and medications reviewed and noted.  Objective: Vital signs are stable alert oriented x 3.  Pulses are palpable.  She has severe pain on palpation medial calcaneal tubercles left foot.  She also has ingrown toenail to the tibial-fibular border of the hallux bilateral.  Her MRI does demonstrate a tear of the plantar fascia at its insertion site.  Specifically the medial band.  Assessment: Tear of the plantar fascia medial band.  Ingrown toenails hallux bilateral  Plan: We discussed today in great detail medial band release with an endoscopic plantar fasciotomy PRP injection and surgical matrixectomy's to the lateral border of the hallux right and the medial and lateral border of the hallux left.  She understands this and is amenable to it we discussed this and discussed surgery center and the use of anesthesia.  She understands that she will be in a boot for period of time.

## 2023-09-07 ENCOUNTER — Telehealth: Payer: Self-pay | Admitting: Urology

## 2023-09-07 NOTE — Telephone Encounter (Signed)
 LM for pt to call back when  she is ready to schedule her sx with Dr. Al Corpus.

## 2023-09-10 ENCOUNTER — Telehealth: Payer: Self-pay | Admitting: Podiatry

## 2023-09-10 NOTE — Telephone Encounter (Signed)
 DOS-09/25/23  EPF LT -29893 INJECTION LT-20550 EXC NAIL PERM 1ST BILAT-11750   [employer] EFFECTIVE DATE- 07/15/23  PER THE [employer] PORTAL, PRIOR AUTH HAS BEEN APPROVED FOR CPT CODES [phone] AND 11750. GOOD FROM 09/25/23 - 12/24/23.  AUTH REF #: [id number]

## 2023-09-24 ENCOUNTER — Other Ambulatory Visit: Payer: Self-pay | Admitting: Podiatry

## 2023-09-24 MED ORDER — ONDANSETRON HCL 4 MG PO TABS: 4.0000 mg | ORAL_TABLET | Freq: Three times a day (TID) | ORAL | 0 refills | Status: AC | PRN

## 2023-09-24 MED ORDER — OXYCODONE-ACETAMINOPHEN 10-325 MG PO TABS: 1.0000 | ORAL_TABLET | Freq: Three times a day (TID) | ORAL | 0 refills | Status: AC | PRN

## 2023-09-24 MED ORDER — CEPHALEXIN 500 MG PO CAPS: 500.0000 mg | ORAL_CAPSULE | Freq: Three times a day (TID) | ORAL | 0 refills | Status: DC

## 2023-09-25 DIAGNOSIS — M722 Plantar fascial fibromatosis: Secondary | ICD-10-CM | POA: Diagnosis not present

## 2023-09-25 DIAGNOSIS — L600 Ingrowing nail: Secondary | ICD-10-CM | POA: Diagnosis not present

## 2023-09-30 ENCOUNTER — Ambulatory Visit (INDEPENDENT_AMBULATORY_CARE_PROVIDER_SITE_OTHER): Payer: Medicare Other | Admitting: Podiatry

## 2023-09-30 ENCOUNTER — Encounter: Payer: Self-pay | Admitting: Podiatry

## 2023-09-30 DIAGNOSIS — Z9889 Other specified postprocedural states: Secondary | ICD-10-CM

## 2023-09-30 DIAGNOSIS — S93692D Other sprain of left foot, subsequent encounter: Secondary | ICD-10-CM

## 2023-09-30 DIAGNOSIS — L600 Ingrowing nail: Secondary | ICD-10-CM

## 2023-09-30 NOTE — Progress Notes (Signed)
 She presents today postop visit #1 date of surgery September 25, 2023.  She is status post EPF left and matrixectomy medial lateral border of the hallux left lateral border of the hallux right she states that has not been too great but has not been too bad.  She said the toes been hurting the most.  Objective: Vital signs stable alert oriented x 3 presents today ambulating cam boot left Darco shoe right.  Dry sterile dressings removed from all surgical sites demonstrate some oozing some drainage but no erythema cellulitis drainage or odor sutures are intact margins well coapted.  Assessment: Well-healing surgical toes and heels.  Plan: I place Band-Aids all surgical sites encouraged her to continue the use of the cam boot for another week and Darco shoe to the right foot.

## 2023-10-06 ENCOUNTER — Telehealth: Payer: Self-pay | Admitting: Podiatry

## 2023-10-06 ENCOUNTER — Ambulatory Visit (INDEPENDENT_AMBULATORY_CARE_PROVIDER_SITE_OTHER): Admitting: Podiatry

## 2023-10-06 DIAGNOSIS — Z9889 Other specified postprocedural states: Secondary | ICD-10-CM

## 2023-10-06 DIAGNOSIS — L600 Ingrowing nail: Secondary | ICD-10-CM

## 2023-10-06 MED ORDER — DOXYCYCLINE HYCLATE 100 MG PO TABS: 100.0000 mg | ORAL_TABLET | Freq: Two times a day (BID) | ORAL | 0 refills | Status: AC

## 2023-10-06 NOTE — Progress Notes (Signed)
 Subjective:  Patient ID: Catherine Mcclain, female    DOB: [birth year],  MRN: [id number]  Chief Complaint  Patient presents with   Routine Post Op    DOS-09/25/23 EXC NAIL PERM 1ST BILAT    DOS: 09/25/2023 Procedure: Left hallux medial and lateral border permanent excision  68 y.o. female returns for post-op check.  She states that she is doing well.  Denies any other acute complaints  Review of Systems: Negative except as noted in the HPI. Denies N/V/F/Ch.  Past Medical History:  Diagnosis Date   Anemia    AS A TEENAGER   Arthritis    Asthma    WELL CONTROLLED   Hyperlipidemia    Hypertension    Hypothyroidism    Obesity    Osteopenia     Current Outpatient Medications:    doxycycline (VIBRA-TABS) 100 MG tablet, Take 1 tablet (100 mg total) by mouth 2 (two) times daily., Disp: 20 tablet, Rfl: 0   albuterol (PROAIR HFA) 108 (90 Base) MCG/ACT inhaler, Inhale 2 puffs into the lungs every 4 (four) hours as needed for shortness of breath., Disp: , Rfl:    Ascorbic Acid (VITAMIN C) 1000 MG tablet, Take 1,000 mg by mouth daily. , Disp: , Rfl:    aspirin EC 81 MG tablet, Take 1 tablet by mouth daily., Disp: , Rfl:    cephALEXin (KEFLEX) 500 MG capsule, Take 1 capsule (500 mg total) by mouth 3 (three) times daily., Disp: 30 capsule, Rfl: 0   cetirizine (ZYRTEC) 10 MG tablet, Take 10 mg by mouth every morning. , Disp: , Rfl:    cholecalciferol (VITAMIN D3) 25 MCG (1000 UT) tablet, Take 1,000 Units by mouth daily., Disp: , Rfl:    COLACE 100 MG capsule, Take 1 capsule every day by oral route., Disp: , Rfl:    Fluticasone-Salmeterol (ADVAIR) 250-50 MCG/DOSE AEPB, Inhale 1 puff into the lungs 2 (two) times daily. , Disp: , Rfl:    gabapentin (NEURONTIN) 400 MG capsule, Take 1 capsule (400 mg total) by mouth 2 (two) times daily. (Patient not taking: Reported on 08/24/2023), Disp: 60 capsule, Rfl: 3   Glucosamine-Chondroitin (OSTEO BI-FLEX REGULAR STRENGTH PO), Take 2 tablets by mouth daily.,  Disp: , Rfl:    levothyroxine (SYNTHROID) 150 MCG tablet, Take 150 mcg by mouth every morning., Disp: , Rfl:    magnesium oxide (MAG-OX) 400 MG tablet, Take 400 mg by mouth daily., Disp: , Rfl:    meloxicam (MOBIC) 15 MG tablet, Take 1 tablet (15 mg total) by mouth daily., Disp: 30 tablet, Rfl: 3   methocarbamol (ROBAXIN) 500 MG tablet, Take 500 mg by mouth 2 (two) times daily. (Patient not taking: Reported on 08/24/2023), Disp: , Rfl:    montelukast (SINGULAIR) 10 MG tablet, 1 TABLET, ORAL, DAILY (Patient taking differently: Take 10 mg by mouth every morning.), Disp: 90 tablet, Rfl: 1   Multiple Vitamin (MULTI-VITAMINS) TABS, Take 2 tablets by mouth daily., Disp: , Rfl:    olmesartan (BENICAR) 20 MG tablet, Take 20 mg by mouth daily., Disp: , Rfl:    Omega-3 Fatty Acids (FISH OIL) 1000 MG CAPS, Take 1,000 mg by mouth daily., Disp: , Rfl:    ondansetron (ZOFRAN) 4 MG tablet, Take 1 tablet (4 mg total) by mouth every 8 (eight) hours as needed., Disp: 20 tablet, Rfl: 0   OVER THE COUNTER MEDICATION, Take 2 tablets by mouth daily. Vitafusion Calcium, Disp: , Rfl:    rosuvastatin (CRESTOR) 5 MG tablet, Take 5 mg  by mouth daily., Disp: , Rfl:    vitamin B-12 (CYANOCOBALAMIN) 500 MCG tablet, Take 1,000 mcg by mouth daily., Disp: , Rfl:   Social History   Tobacco Use  Smoking Status Never  Smokeless Tobacco Never    Allergies  Allergen Reactions   Methocarbamol Shortness Of Breath   Pollen Extract     Other reaction(s): Not available, Other (See Comments) Sneezing, itching, cough    Short Ragweed Pollen Ext     Other reaction(s): Not available   Objective:  There were no vitals filed for this visit. There is no height or weight on file to calculate BMI. Constitutional Well developed. Well nourished.  Vascular Foot warm and well perfused. Capillary refill normal to all digits.   Neurologic Normal speech. Oriented to person, place, and time. Epicritic sensation to light touch grossly  present bilaterally.  Dermatologic Skin healing well without signs of infection. Skin edges well coapted without signs of infection.  Orthopedic: Tenderness to palpation noted about the surgical site.   Radiographs: None Assessment:   1. Ingrown left big toenail   2. Status post foot surgery    Plan:  Patient was evaluated and treated and all questions answered.  S/p foot surgery left -Progressing as expected post-operatively. -XR: See above -WB Status: Weightbearing as tolerated in surgical shoe -Sutures: Intact.  No signs of Deis and no no complication noted mild erythema noted -Medications: Medication switch to doxycycline -Encouraged her to do Betadine wet-to-dry dressing once a day  No follow-ups on file.

## 2023-10-06 NOTE — Telephone Encounter (Signed)
 Pt is post surgery 3/14. She stating that toe is not looking good. Blackish/bluish in color and not sure if it should be that way. Please call back. She will try to upload a picture as well.

## 2023-10-07 ENCOUNTER — Encounter: Admitting: Podiatry

## 2023-10-14 ENCOUNTER — Ambulatory Visit (INDEPENDENT_AMBULATORY_CARE_PROVIDER_SITE_OTHER): Payer: Medicare Other | Admitting: Podiatry

## 2023-10-14 DIAGNOSIS — L600 Ingrowing nail: Secondary | ICD-10-CM

## 2023-10-14 DIAGNOSIS — Z9889 Other specified postprocedural states: Secondary | ICD-10-CM

## 2023-10-14 DIAGNOSIS — S93692D Other sprain of left foot, subsequent encounter: Secondary | ICD-10-CM

## 2023-10-14 NOTE — Progress Notes (Signed)
 She presents today for follow-up of her surgical foot left.  She states that seems to be doing pretty well as she refers to the endoscopic fasciotomy and the surgical matrixectomy's of the hallux bilaterally.  Objective: Vital signs are stable alert oriented x 3 dressings intact presents with her cam walker left foot.  Once removed demonstrates sutures are intact margins well coapted removed all of the sutures today fibular border of the hallux right appears to be the only area that might be having some drainage.  I encouraged Betadine use on this daily.  She has much decrease in pain on palpation to the medial band of the plantar fascia left.  Assessment: Resolving plantar fasciitis symptomatology status post EPF and surgical matrixectomy's of the hallux bilateral both borders.  Plan: Recommended application of Betadine to the toes daily and that she wear her tennis shoes at all times during the day and cam boot for 1 month at nighttime.  I will follow-up with her in 2 to 3 weeks

## 2023-11-04 ENCOUNTER — Ambulatory Visit (INDEPENDENT_AMBULATORY_CARE_PROVIDER_SITE_OTHER): Admitting: Podiatry

## 2023-11-04 ENCOUNTER — Encounter: Payer: Self-pay | Admitting: Podiatry

## 2023-11-04 ENCOUNTER — Encounter: Payer: Medicare Other | Admitting: Podiatry

## 2023-11-04 DIAGNOSIS — S93692A Other sprain of left foot, initial encounter: Secondary | ICD-10-CM

## 2023-11-04 DIAGNOSIS — Z9889 Other specified postprocedural states: Secondary | ICD-10-CM

## 2023-11-04 DIAGNOSIS — M722 Plantar fascial fibromatosis: Secondary | ICD-10-CM

## 2023-11-04 NOTE — Progress Notes (Signed)
 She presents today for postop visit date of surgery 09/25/2023 endoscopic plantar fasciotomy and PRP injection surgical matrixectomy hallux bilaterally.  She states that there is a small piece of stitch left in the outside of the hallux right.  More easily seen now since the toes are scab free.  States that he feels better but still has some pain in the heel.  Objective: Vital signs stable alert oriented x 3.  Pulses are palpable.  There is no erythema edema cellulitis drainage or odor she still has some tenderness with hardness on palpation between the 2 port sites on the plantar aspect of the foot this is consistent with hematoma from the fasciotomy and the cannula it was placed across the foot.  Assessment retention of a single Prolene stitch fibular border hallux right well-healing fasciotomy left.  Plan: Removal of the stitch today and allow her to stop wearing the cam boot at nighttime and stay in her tennis shoes.  She is not to walk barefoot she is not exercise at this point.  I would like to follow-up with her in 1 month for her release.

## 2023-11-05 NOTE — Addendum Note (Signed)
 Addended by: Sanda Crome on: 11/05/2023 08:41 AM   Modules accepted: Level of Service

## 2023-12-23 ENCOUNTER — Encounter: Payer: Self-pay | Admitting: Podiatry

## 2023-12-23 ENCOUNTER — Ambulatory Visit (INDEPENDENT_AMBULATORY_CARE_PROVIDER_SITE_OTHER): Admitting: Podiatry

## 2023-12-23 DIAGNOSIS — S93692D Other sprain of left foot, subsequent encounter: Secondary | ICD-10-CM

## 2023-12-23 DIAGNOSIS — Z9889 Other specified postprocedural states: Secondary | ICD-10-CM

## 2023-12-23 NOTE — Progress Notes (Signed)
 Presents today date of surgery 09/25/2023 status post endoscopic fasciotomy with injection of the contralateral heel matrixectomy of the hallux bilateral borders.  She states that she does a lot of walking and bothers her but it has improved considerably and she continues to still massage the bottom of the foot to help regain any scar tissue.  Objective: Vital signs are stable oriented x 3.  There is no erythema edema cellulitis drainage or odor.  She does have some firmness on palpation of the plantar medial aspect of the heel near the insertion site of the plantar fascia.  This I do believe is from the trocar and 4 we release the fascia.  Assessment: Well-healing surgical foot.  Plan: Continue physical therapy at home follow-up with me should any of this worsen.

## 2024-02-22 ENCOUNTER — Other Ambulatory Visit: Payer: Self-pay | Admitting: Nurse Practitioner

## 2024-02-22 DIAGNOSIS — Z1231 Encounter for screening mammogram for malignant neoplasm of breast: Secondary | ICD-10-CM

## 2024-03-07 ENCOUNTER — Ambulatory Visit: Admitting: Podiatry

## 2024-04-04 ENCOUNTER — Ambulatory Visit (INDEPENDENT_AMBULATORY_CARE_PROVIDER_SITE_OTHER): Admitting: Podiatry

## 2024-04-04 ENCOUNTER — Ambulatory Visit
Admission: RE | Admit: 2024-04-04 | Discharge: 2024-04-04 | Disposition: A | Discharge status: Home / Self Care | Source: Ambulatory Visit | Attending: Nurse Practitioner | Admitting: Nurse Practitioner

## 2024-04-04 ENCOUNTER — Encounter: Payer: Self-pay | Admitting: Podiatry

## 2024-04-04 DIAGNOSIS — M722 Plantar fascial fibromatosis: Secondary | ICD-10-CM

## 2024-04-04 DIAGNOSIS — Z1231 Encounter for screening mammogram for malignant neoplasm of breast: Secondary | ICD-10-CM | POA: Insufficient documentation

## 2024-04-04 DIAGNOSIS — S96912D Strain of unspecified muscle and tendon at ankle and foot level, left foot, subsequent encounter: Secondary | ICD-10-CM | POA: Diagnosis not present

## 2024-04-04 NOTE — Progress Notes (Signed)
 She presents today for follow-up of her left foot.  We did surgery back in March where we released the medial band of the plantar fascia.  She states that she still gets tightness and pulling when she is walking and cannot walk for far distances without pain in that left foot.  She states that she has been wearing her old orthotics and that they feel better but she is only been doing that for a week or so.  States these orthotics are really old she states that they may be more than 2 or 68 years old.  Objective: Vital signs are stable alert and oriented x 3.  Pulses are palpable.  She has tenderness on palpation of the area on the plantar medial aspect of the foot just distal to the endoscopic plantar fasciotomy site.  The plantar fascia does not appear to be tight but I do imagine she has tenderness in the intrinsic muscles.  She does have pain on palpation of the deep muscles and she also has pain with flexion of her toes.  I evaluated the orthotics they appear to be reasonably good shape.  I recommended that she continue to wear the orthotics in her good shoes.  Assessment muscle strain left foot secondary to EPF and flattening out of the foot.  Plan: Recommended that she try to wear her orthotics regularly and if this does improve that she may need some new ones made and I suggested that she call and make an appointment with Trish for new orthotics.

## 2024-06-15 ENCOUNTER — Ambulatory Visit: Admitting: Podiatry

## 2024-06-15 DIAGNOSIS — M19072 Primary osteoarthritis, left ankle and foot: Secondary | ICD-10-CM

## 2024-06-15 DIAGNOSIS — M722 Plantar fascial fibromatosis: Secondary | ICD-10-CM

## 2024-06-15 DIAGNOSIS — S96912D Strain of unspecified muscle and tendon at ankle and foot level, left foot, subsequent encounter: Secondary | ICD-10-CM

## 2024-06-15 NOTE — Progress Notes (Signed)
 She presents today for follow-up of her surgical foot left.  States that she still feels some pulling in the arch she also states that there are some symptoms on the right foot but not a lot.  States that she feels that she may overdo it occasionally and it really acts up on those days.  States that the majority of the pain is right here she points beneath the first metatarsal medial cuneiform joint area.  Objective: Vital signs are stable alert and oriented x 3.  She still has some residual pain on palpation medial calcaneal tubercle and medial band of the plantar fascia.  Most of the pain is located beneath the first tarsometatarsal joint most likely arthritic in nature.  Assessment: Residual plantar fasciitis status post endoscopic fasciotomy and osteoarthritis midfoot left.  Plan: I highly recommend orthotics three-quarter length with the top-cover and extrinsic post.

## 2024-06-16 ENCOUNTER — Encounter: Payer: Self-pay | Admitting: Podiatrist

## 2024-06-16 NOTE — Progress Notes (Signed)
 Orthotic Scan  Foam box was scanned and order entered per Dr Pansy order instructions-    Extrinsic post-  3/4 length device.  Order was submitted via [facility] and is being processed.  We will contact patient when orthotics are in and ready for fitting.

## 2024-06-30 ENCOUNTER — Telehealth: Payer: Self-pay

## 2024-06-30 NOTE — Telephone Encounter (Signed)
 Patient's orthotics are in and are in [facility] bin ready to be sent to [facility]. Charges are not entered yet.

## 2024-08-04 NOTE — Telephone Encounter (Signed)
 Charges need to be entered when orthotics are dispensed to patient

## 2024-08-12 ENCOUNTER — Other Ambulatory Visit

## 2024-08-12 ENCOUNTER — Telehealth: Payer: Self-pay | Admitting: Podiatry

## 2024-08-12 NOTE — Telephone Encounter (Signed)
 Orthotics in [facility] called pt left a message for her to call and schedule appt [facility].
